# Patient Record
Sex: Female | Born: 1985 | Race: White | Hispanic: No
Health system: Midwestern US, Community
[De-identification: ages and names within clinical notes are randomized; demographics above are authoritative.]

## PROBLEM LIST (undated history)

## (undated) DIAGNOSIS — F1910 Other psychoactive substance abuse, uncomplicated: Secondary | ICD-10-CM

## (undated) DIAGNOSIS — J45909 Unspecified asthma, uncomplicated: Secondary | ICD-10-CM

## (undated) DIAGNOSIS — R569 Unspecified convulsions: Secondary | ICD-10-CM

## (undated) DIAGNOSIS — M5430 Sciatica, unspecified side: Secondary | ICD-10-CM

## (undated) DIAGNOSIS — F32A Depression, unspecified: Secondary | ICD-10-CM

## (undated) DIAGNOSIS — D649 Anemia, unspecified: Secondary | ICD-10-CM

## (undated) DIAGNOSIS — I639 Cerebral infarction, unspecified: Secondary | ICD-10-CM

## (undated) DIAGNOSIS — K5900 Constipation, unspecified: Secondary | ICD-10-CM

## (undated) DIAGNOSIS — J449 Chronic obstructive pulmonary disease, unspecified: Secondary | ICD-10-CM

## (undated) DIAGNOSIS — I10 Essential (primary) hypertension: Secondary | ICD-10-CM

## (undated) DIAGNOSIS — F419 Anxiety disorder, unspecified: Secondary | ICD-10-CM

## (undated) HISTORY — DX: Cerebral infarction, unspecified: I63.9

## (undated) HISTORY — DX: Chronic obstructive pulmonary disease, unspecified: J44.9

## (undated) HISTORY — DX: Anxiety disorder, unspecified: F41.9

## (undated) HISTORY — DX: Unspecified convulsions: R56.9

## (undated) HISTORY — DX: Other psychoactive substance abuse, uncomplicated: F19.10

## (undated) HISTORY — PX: TONSILLECTOMY: SUR1361

---
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
## 2006-02-13 ENCOUNTER — Ambulatory Visit (HOSPITAL_COMMUNITY): Admission: RE | Admit: 2006-02-13 | Discharge: 2006-02-13 | Payer: Self-pay | Admitting: General Surgery

## 2006-04-13 ENCOUNTER — Emergency Department (HOSPITAL_COMMUNITY): Admission: EM | Admit: 2006-04-13 | Discharge: 2006-04-14 | Payer: Self-pay | Admitting: *Deleted

## 2006-08-04 ENCOUNTER — Emergency Department (HOSPITAL_COMMUNITY): Admission: EM | Admit: 2006-08-04 | Discharge: 2006-08-04 | Payer: Self-pay | Admitting: Emergency Medicine

## 2008-10-04 DIAGNOSIS — G43909 Migraine, unspecified, not intractable, without status migrainosus: Secondary | ICD-10-CM | POA: Insufficient documentation

## 2009-03-03 DIAGNOSIS — M545 Low back pain, unspecified: Secondary | ICD-10-CM | POA: Insufficient documentation

## 2009-06-12 DIAGNOSIS — J45909 Unspecified asthma, uncomplicated: Secondary | ICD-10-CM | POA: Diagnosis present

## 2010-02-16 DIAGNOSIS — F411 Generalized anxiety disorder: Secondary | ICD-10-CM | POA: Insufficient documentation

## 2010-02-16 DIAGNOSIS — F309 Manic episode, unspecified: Secondary | ICD-10-CM | POA: Insufficient documentation

## 2010-05-25 NOTE — Op Note (Signed)
NAMEBUSHRA, Vicki Whitehead               ACCOUNT NO.:  [id number]   MEDICAL RECORD NO.:  [id number]          PATIENT TYPE:  AMB   LOCATION:  DAY                          FACILITY:  [facility]   PHYSICIAN:  Ollen Gross. Vernell Morgans, M.D. DATE OF BIRTH:  1985-06-24   DATE OF PROCEDURE:  02/13/2006  DATE OF DISCHARGE:                               OPERATIVE REPORT   PREOPERATIVE DIAGNOSIS:  Pilonidal cyst.   POSTOPERATIVE DIAGNOSIS:  Pilonidal cyst.   PROCEDURE:  I&D of pilonidal cyst.   SURGEON:  Dr. Carolynne Edouard   ANESTHESIA:  General endotracheal.   PROCEDURE:  After informed consent was obtained, the patient was brought  to the operating room, left in the supine position on the stretcher.  After adequate induction of general anesthesia, the patient was moved  into a prone position on the operating room table and all pressure  points padded.  The patient's buttocks were retracted laterally with  tape.  The gluteal cleft area was prepped with Betadine and draped in  usual sterile manner.  The patient had several pits in the skin of the  gluteal cleft.  These were probed with a small silver probe, and a small  little cavity was able to be appreciated.  The cavity was opened with a  15 blade knife cutting down on the silver probe.  The cavity was filled  with some hair and granulation tissue which was removed.  The wound was  debrided sharply with the electrocautery until the area was completely  clean and hemostatic.  The wound was infiltrated with 0.25% Marcaine.  Once the wound was cleaned, it was then packed with gauze, and sterile  dressings were applied.  The patient tolerated the procedure well.  At  the end of the case, all needle, sponge, and instrument counts were  correct.  The patient was then awakened and taken to recovery in stable  condition.      Ollen Gross. Vernell Morgans, M.D.  Electronically Signed     PST/MEDQ  D:  02/13/2006  T:  02/13/2006  Job:  [id number]

## 2010-10-22 LAB — URINE MICROSCOPIC-ADD ON

## 2010-10-22 LAB — CBC
HCT: 31.3 — ABNORMAL LOW
Hemoglobin: 11 — ABNORMAL LOW
RBC: 3.71 — ABNORMAL LOW
WBC: 13 — ABNORMAL HIGH

## 2010-10-22 LAB — URINE CULTURE: Culture: NO GROWTH

## 2010-10-22 LAB — URINALYSIS, ROUTINE W REFLEX MICROSCOPIC
Bilirubin Urine: NEGATIVE
Glucose, UA: NEGATIVE
Hgb urine dipstick: NEGATIVE
Nitrite: NEGATIVE
Protein, ur: NEGATIVE
Specific Gravity, Urine: 1.025
Urobilinogen, UA: 0.2
pH: 6.5

## 2010-10-22 LAB — DIFFERENTIAL
Eosinophils Relative: 1
Lymphocytes Relative: 17
Lymphs Abs: 2.2
Monocytes Absolute: 1.5 — ABNORMAL HIGH
Monocytes Relative: 12 — ABNORMAL HIGH

## 2010-10-22 LAB — BASIC METABOLIC PANEL
BUN: 6
CO2: 26
Calcium: 8.7
Chloride: 100
Creatinine, Ser: 0.6
GFR calc Af Amer: >60
GFR calc non Af Amer: >60
Glucose, Bld: 94
Potassium: 3.7
Sodium: 134 — ABNORMAL LOW

## 2010-10-22 LAB — PREGNANCY, URINE: Preg Test, Ur: POSITIVE

## 2011-06-12 DIAGNOSIS — R200 Anesthesia of skin: Secondary | ICD-10-CM | POA: Insufficient documentation

## 2012-04-13 DIAGNOSIS — Z5181 Encounter for therapeutic drug level monitoring: Secondary | ICD-10-CM | POA: Insufficient documentation

## 2012-04-13 DIAGNOSIS — M533 Sacrococcygeal disorders, not elsewhere classified: Secondary | ICD-10-CM | POA: Insufficient documentation

## 2012-04-13 DIAGNOSIS — M5430 Sciatica, unspecified side: Secondary | ICD-10-CM | POA: Insufficient documentation

## 2012-04-13 DIAGNOSIS — G894 Chronic pain syndrome: Secondary | ICD-10-CM | POA: Insufficient documentation

## 2012-04-13 DIAGNOSIS — M25552 Pain in left hip: Secondary | ICD-10-CM | POA: Insufficient documentation

## 2013-10-09 ENCOUNTER — Encounter (HOSPITAL_COMMUNITY): Payer: Self-pay | Admitting: Emergency Medicine

## 2013-10-09 ENCOUNTER — Emergency Department (HOSPITAL_COMMUNITY): Payer: Medicaid Other

## 2013-10-09 ENCOUNTER — Emergency Department (HOSPITAL_COMMUNITY)
Admission: EM | Admit: 2013-10-09 | Discharge: 2013-10-09 | Disposition: A | Discharge status: Home / Self Care | Payer: Medicaid Other | Attending: Emergency Medicine | Admitting: Emergency Medicine

## 2013-10-09 DIAGNOSIS — Z3202 Encounter for pregnancy test, result negative: Secondary | ICD-10-CM | POA: Insufficient documentation

## 2013-10-09 DIAGNOSIS — Z8719 Personal history of other diseases of the digestive system: Secondary | ICD-10-CM | POA: Insufficient documentation

## 2013-10-09 DIAGNOSIS — Z8739 Personal history of other diseases of the musculoskeletal system and connective tissue: Secondary | ICD-10-CM | POA: Diagnosis not present

## 2013-10-09 DIAGNOSIS — R109 Unspecified abdominal pain: Secondary | ICD-10-CM | POA: Diagnosis present

## 2013-10-09 DIAGNOSIS — Z720 Tobacco use: Secondary | ICD-10-CM | POA: Insufficient documentation

## 2013-10-09 DIAGNOSIS — Z862 Personal history of diseases of the blood and blood-forming organs and certain disorders involving the immune mechanism: Secondary | ICD-10-CM | POA: Insufficient documentation

## 2013-10-09 DIAGNOSIS — R1011 Right upper quadrant pain: Secondary | ICD-10-CM | POA: Insufficient documentation

## 2013-10-09 DIAGNOSIS — F1910 Other psychoactive substance abuse, uncomplicated: Secondary | ICD-10-CM | POA: Insufficient documentation

## 2013-10-09 DIAGNOSIS — Z79899 Other long term (current) drug therapy: Secondary | ICD-10-CM | POA: Insufficient documentation

## 2013-10-09 DIAGNOSIS — Z21 Asymptomatic human immunodeficiency virus [HIV] infection status: Secondary | ICD-10-CM | POA: Insufficient documentation

## 2013-10-09 DIAGNOSIS — I10 Essential (primary) hypertension: Secondary | ICD-10-CM | POA: Insufficient documentation

## 2013-10-09 DIAGNOSIS — B348 Other viral infections of unspecified site: Secondary | ICD-10-CM | POA: Diagnosis not present

## 2013-10-09 DIAGNOSIS — J45909 Unspecified asthma, uncomplicated: Secondary | ICD-10-CM | POA: Diagnosis not present

## 2013-10-09 DIAGNOSIS — N8320 Unspecified ovarian cysts: Secondary | ICD-10-CM

## 2013-10-09 DIAGNOSIS — B349 Viral infection, unspecified: Secondary | ICD-10-CM

## 2013-10-09 HISTORY — DX: Anemia, unspecified: D64.9

## 2013-10-09 HISTORY — DX: Unspecified asthma, uncomplicated: J45.909

## 2013-10-09 HISTORY — DX: Sciatica, unspecified side: M54.30

## 2013-10-09 HISTORY — DX: Essential (primary) hypertension: I10

## 2013-10-09 HISTORY — DX: Constipation, unspecified: K59.00

## 2013-10-09 LAB — CBC WITH DIFFERENTIAL/PLATELET
BASOS ABS: 0 10*3/uL (ref 0.0–0.1)
Basophils Relative: 0 % (ref 0–1)
EOS PCT: 2 % (ref 0–5)
Eosinophils Absolute: 0.2 10*3/uL (ref 0.0–0.7)
HEMATOCRIT: 32.1 % — AB (ref 36.0–46.0)
Hemoglobin: 11 g/dL — ABNORMAL LOW (ref 12.0–15.0)
LYMPHS ABS: 2 10*3/uL (ref 0.7–4.0)
Lymphocytes Relative: 23 % (ref 12–46)
MCH: 28 pg (ref 26.0–34.0)
MCHC: 34.3 g/dL (ref 30.0–36.0)
MCV: 81.7 fL (ref 78.0–100.0)
MONO ABS: 0.8 10*3/uL (ref 0.1–1.0)
MONOS PCT: 9 % (ref 3–12)
NEUTROS ABS: 5.9 10*3/uL (ref 1.7–7.7)
Neutrophils Relative %: 66 % (ref 43–77)
PLATELETS: 226 10*3/uL (ref 150–400)
RBC: 3.93 MIL/uL (ref 3.87–5.11)
RDW: 13.3 % (ref 11.5–15.5)
WBC: 8.9 10*3/uL (ref 4.0–10.5)

## 2013-10-09 LAB — URINALYSIS, ROUTINE W REFLEX MICROSCOPIC
BILIRUBIN URINE: NEGATIVE
Glucose, UA: NEGATIVE mg/dL
KETONES UR: NEGATIVE mg/dL
LEUKOCYTES UA: NEGATIVE
NITRITE: NEGATIVE
PROTEIN: NEGATIVE mg/dL
Specific Gravity, Urine: 1.014 (ref 1.005–1.030)
UROBILINOGEN UA: 0.2 mg/dL (ref 0.0–1.0)
pH: 6 (ref 5.0–8.0)

## 2013-10-09 LAB — COMPREHENSIVE METABOLIC PANEL
ALT: 36 U/L — ABNORMAL HIGH (ref 0–35)
ANION GAP: 14 (ref 5–15)
AST: 24 U/L (ref 0–37)
Albumin: 3.5 g/dL (ref 3.5–5.2)
Alkaline Phosphatase: 161 U/L — ABNORMAL HIGH (ref 39–117)
BILIRUBIN TOTAL: 0.5 mg/dL (ref 0.3–1.2)
BUN: 6 mg/dL (ref 6–23)
CALCIUM: 9.3 mg/dL (ref 8.4–10.5)
CHLORIDE: 99 meq/L (ref 96–112)
CO2: 24 meq/L (ref 19–32)
CREATININE: 0.69 mg/dL (ref 0.50–1.10)
Glucose, Bld: 145 mg/dL — ABNORMAL HIGH (ref 70–99)
Potassium: 3.5 mEq/L — ABNORMAL LOW (ref 3.7–5.3)
Sodium: 137 mEq/L (ref 137–147)
Total Protein: 8.1 g/dL (ref 6.0–8.3)

## 2013-10-09 LAB — RAPID HIV SCREEN (WH-MAU): SUDS RAPID HIV SCREEN: REACTIVE — AB

## 2013-10-09 LAB — HEPATITIS PANEL, ACUTE
HCV Ab: REACTIVE — AB
HEP A IGM: NONREACTIVE
HEP B C IGM: NONREACTIVE
Hepatitis B Surface Ag: NEGATIVE

## 2013-10-09 LAB — HEMOGLOBIN A1C
Hgb A1c MFr Bld: 5.2 % (ref <5.7)
Mean Plasma Glucose: 103 mg/dL (ref <117)

## 2013-10-09 LAB — INFLUENZA PANEL BY PCR (TYPE A & B)
H1N1FLUPCR: NOT DETECTED
Influenza A By PCR: NEGATIVE
Influenza B By PCR: NEGATIVE

## 2013-10-09 LAB — URINE MICROSCOPIC-ADD ON

## 2013-10-09 LAB — LIPASE, BLOOD: LIPASE: 23 U/L (ref 11–59)

## 2013-10-09 LAB — RPR

## 2013-10-09 LAB — POC URINE PREG, ED: Preg Test, Ur: NEGATIVE

## 2013-10-09 MED ORDER — ONDANSETRON 4 MG PO TBDP: ORAL_TABLET | ORAL | Status: DC

## 2013-10-09 MED ORDER — CLONIDINE HCL 0.1 MG PO TABS: ORAL_TABLET | ORAL | Status: DC

## 2013-10-09 MED ORDER — ONDANSETRON HCL 4 MG/2ML IJ SOLN
4.0000 mg | Freq: Once | INTRAMUSCULAR | Status: AC
  Administered 2013-10-09: 4 mg via INTRAVENOUS
  Filled 2013-10-09: qty 2

## 2013-10-09 MED ORDER — SODIUM CHLORIDE 0.9 % IV SOLN
1000.0000 mL | Freq: Once | INTRAVENOUS | Status: AC
  Administered 2013-10-09: 1000 mL via INTRAVENOUS

## 2013-10-09 MED ORDER — IBUPROFEN 600 MG PO TABS: 600.0000 mg | ORAL_TABLET | Freq: Four times a day (QID) | ORAL | Status: DC | PRN

## 2013-10-09 MED ORDER — SODIUM CHLORIDE 0.9 % IV SOLN: INTRAVENOUS | Status: DC

## 2013-10-09 MED ORDER — ACETAMINOPHEN 500 MG PO TABS
1000.0000 mg | ORAL_TABLET | Freq: Once | ORAL | Status: AC
  Administered 2013-10-09: 1000 mg via ORAL
  Filled 2013-10-09: qty 2

## 2013-10-09 MED ORDER — IOHEXOL 300 MG/ML  SOLN
100.0000 mL | Freq: Once | INTRAMUSCULAR | Status: AC | PRN
  Administered 2013-10-09: 100 mL via INTRAVENOUS

## 2013-10-09 NOTE — ED Notes (Signed)
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  Reported rehab x402months.  Last used this morning at 3a, states "small amount".

## 2013-10-09 NOTE — Consult Note (Signed)
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  I gave her my card and highlighted the number where to find [place].   Thank you so much for testing this patient and for allowing [place] to be involved in the continuity of her care,   Bobby Rumpf (pager) [phone] [email or web address]  [name] is an 28 y.o. female.  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 She aldo complains of neck pain and abd pain.  Her ED w/u has been unremarkable except for new HIV+ test.  Pt states she is with same sex partner for several years. She does use IV drugs.   Past Medical History  Diagnosis Date  . Anemia   . Asthma   . Sciatica   . Hypertension   . Constipation     Past Surgical History  Procedure Laterality Date  . Tonsillectomy    . Cesarean section       No Known Allergies  Medications: Scheduled:   Abtx:  Anti-infectives   None      Total days of antibiotics 0          Social History:  reports that she has been smoking.  She does not have any smokeless tobacco history on file. She reports that she uses illicit drugs (Methamphetamines). She reports that she does not drink alcohol.  History reviewed. No pertinent family history.  General ROS: no change in wt. no LAN, no oral ulcers. constipation+. normal urination. see HPI.   Blood pressure 132/78, pulse 95, temperature 98.3 F (36.8 C), temperature source Oral, resp. rate 22, weight 88.451 kg (195 lb), last menstrual period 08/09/2013, SpO2 100.00%. General appearance: alert, cooperative and no distress Eyes: negative  findings: conjunctivae and sclerae normal and pupils equal, round, reactive to light and accomodation Throat: normal findings: oropharynx pink & moist without lesions or evidence of thrush Neck: no adenopathy and supple, symmetrical, trachea midline Lungs: clear to auscultation bilaterally Heart: regular rate and rhythm Abdomen: normal findings: bowel sounds normal and soft, non-tender Extremities: no edema. UE non-tender, no cordis. multiple wounds on her wristss from injection sites.  No axillary LAN.  She moves her neck fully.    Results for orders placed during the hospital encounter of 10/09/13 (from the past 48 hour(s))  CBC WITH DIFFERENTIAL     Status: Abnormal   Collection Time    10/09/13 12:09 PM      Result Value Ref Range   WBC 8.9  4.0 - 10.5 K/uL   RBC 3.93  3.87 - 5.11 MIL/uL   Hemoglobin 11.0 (*) 12.0 - 15.0 g/dL   HCT 32.1 (*) 36.0 - 46.0 %   MCV 81.7  78.0 - 100.0 fL   MCH 28.0  26.0 - 34.0 pg   MCHC 34.3  30.0 - 36.0 g/dL   RDW 13.3  11.5 - 15.5 %   Platelets 226  150 - 400 K/uL   Neutrophils Relative % 66  43 - 77 %   Lymphocytes Relative 23  12 - 46 %   Monocytes Relative 9  3 - 12 %   Eosinophils Relative 2  0 - 5 %   Basophils Relative 0  0 - 1 %   Neutro Abs 5.9  1.7 - 7.7 K/uL   Lymphs Abs 2.0  0.7 - 4.0 K/uL   Monocytes Absolute 0.8  0.1 - 1.0 K/uL   Eosinophils Absolute 0.2  0.0 - 0.7 K/uL   Basophils Absolute 0.0  0.0 - 0.1 K/uL   WBC Morphology ATYPICAL LYMPHOCYTES    COMPREHENSIVE METABOLIC PANEL     Status: Abnormal   Collection Time    10/09/13 12:09 PM      Result Value Ref Range   Sodium 137  137 - 147 mEq/L   Potassium 3.5 (*) 3.7 - 5.3 mEq/L   Chloride 99  96 - 112 mEq/L   CO2 24  19 - 32 mEq/L   Glucose, Bld 145 (*) 70 - 99 mg/dL   BUN 6  6 - 23 mg/dL   Creatinine, Ser 0.69  0.50 - 1.10 mg/dL   Calcium 9.3  8.4 - 10.5 mg/dL   Total Protein 8.1  6.0 - 8.3 g/dL   Albumin 3.5  3.5 - 5.2 g/dL   AST 24  0 - 37 U/L   ALT 36 (*) 0 - 35  U/L   Alkaline Phosphatase 161 (*) 39 - 117 U/L   Total Bilirubin 0.5  0.3 - 1.2 mg/dL   GFR calc non Af Amer >90  >90 mL/min   GFR calc Af Amer >90  >90 mL/min   Comment: (NOTE)     The eGFR has been calculated using the CKD EPI equation.     This calculation has not been validated in all clinical situations.     eGFR's persistently <90 mL/min signify possible Chronic Kidney     Vicki Whitehead.   Anion gap 14  5 - 15  LIPASE, BLOOD     Status: None   Collection Time    10/09/13 12:09 PM      Result Value Ref Range   Lipase 23  11 - 59 U/L  RAPID HIV SCREEN [facility])     Status: Abnormal   Collection Time    10/09/13  1:13 PM      Result Value Ref Range   SUDS Rapid HIV Screen Reactive (*) NON REACTIVE   Comment: HAMILTON,J AT 1440 ON [id number] BY HOOKER,B                This test is not diagnostic     of HIV infection and must be     sent for confirmatory testing     by [facility]ot before rendering     a diagnosis of positivity for     HIV infection.     Sent for confirmatory testing.     CRITICAL RESULT CALLED TO, READ BACK BY AND VERIFIED WITH:     CORRECTED ON 10/03 AT 1445: PREVIOUSLY REPORTED AS RESISTANT HAMILTON,J AT 1440 ON [id number] BY HOOKER,B  URINALYSIS, ROUTINE W REFLEX MICROSCOPIC     Status: Abnormal   Collection Time    10/09/13  1:22 PM      Result Value Ref Range   Color, Urine YELLOW  YELLOW   APPearance CLOUDY (*) CLEAR   Specific Gravity, Urine 1.014  1.005 - 1.030   pH 6.0  5.0 - 8.0   Glucose, UA NEGATIVE  NEGATIVE mg/dL   Hgb urine dipstick MODERATE (*) NEGATIVE   Bilirubin Urine NEGATIVE  NEGATIVE  Ketones, ur NEGATIVE  NEGATIVE mg/dL   Protein, ur NEGATIVE  NEGATIVE mg/dL   Urobilinogen, UA 0.2  0.0 - 1.0 mg/dL   Nitrite NEGATIVE  NEGATIVE   Leukocytes, UA NEGATIVE  NEGATIVE  URINE MICROSCOPIC-ADD ON     Status: Abnormal   Collection Time    10/09/13  1:22 PM      Result Value Ref Range   Squamous Epithelial / LPF FEW (*) RARE   WBC, UA 0-2  <3  WBC/hpf   RBC / HPF 3-6  <3 RBC/hpf   Bacteria, UA RARE  RARE   Urine-Other MUCOUS PRESENT    POC URINE PREG, ED     Status: None   Collection Time    10/09/13  1:28 PM      Result Value Ref Range   Preg Test, Ur NEGATIVE  NEGATIVE   Comment:            THE SENSITIVITY OF THIS     METHODOLOGY IS >24 mIU/mL      Component Value Date/Time   SDES URINE, CLEAN CATCH 08/04/2006 1333   SPECREQUEST NONE 08/04/2006 1333   CULT NO GROWTH 08/04/2006 1333   REPTSTATUS 08/05/2006 FINAL 08/04/2006 1333   Dg Chest 2 View  10/09/2013   CLINICAL DATA:  Shortness of breath, cough, and fever with right-sided chest pain; history of asthma and tobacco use  EXAM: CHEST  2 VIEW  COMPARISON:  None.  FINDINGS: The lungs are adequately inflated and clear. The heart and pulmonary vascularity are normal. The mediastinum is normal in width. There is no pleural effusion or pneumothorax. The bony thorax is unremarkable.  IMPRESSION: There is no evidence of pneumonia nor other acute cardiopulmonary abnormality.   Electronically Signed   By: David  Martinique   On: 10/09/2013 13:39   Ct Abdomen Pelvis W Contrast  10/09/2013   CLINICAL DATA:  Sick for 5 days, with elevated liver enzymes, nausea, and diarrhea, history of C-section  EXAM: CT ABDOMEN AND PELVIS WITH CONTRAST  TECHNIQUE: Multidetector CT imaging of the abdomen and pelvis was performed using the standard protocol following bolus administration of intravenous contrast.  CONTRAST:  18m OMNIPAQUE IOHEXOL 300 MG/ML  SOLN  COMPARISON:  None.  FINDINGS: Liver is enlarged at 21.4 cm. Spleen is enlarged at 16.7 cm. No focal hepatic or splenic lesions identified. There is fluid in the gallbladder fossa. There may be sludge within the gallbladder lumen.  The pancreas is normal. Adrenal glands are normal. Kidneys are normal.  There are numerous small retroperitoneal periaortic and aortocaval lymph nodes, with the largest measuring 9 mm in short axis, below the level of the left  kidney.  Stomach is normal. Small bowel is normal. Appendix is normal. Large bowel is normal.  Bladder is normal. Uterus is normal. 27 mm cystic lesion left ovary noted. Tiny nodule within the anterior aspect of the cyst may represent volume averaging, appearing as a 3 mm nodule. Right adnexa normal.  No free fluid in the abdomen or pelvis. No acute musculoskeletal findings. Visualized portions of the lung bases clear.  IMPRESSION: Hepatosplenomegaly with no focal hepatic or splenic abnormalities.  Fluid in the gallbladder fossa which is a nonspecific finding given report of and findings to suggest hepatic parenchymal Vicki Whitehead. However cholecystitis not excluded. There may be sludge in the gallbladder. If indicated consider right upper quadrant abdomen ultrasound.  2.7 mm probably benign left ovarian cystic lesion. Given the appearance of a tiny nodule however, would recommend further characterization  with pelvic ultrasound. This recommendation follows ACR consensus guidelines: White Paper of the ACR Incidental Findings Committee II on Adnexal Findings. J Am Coll Radiol [phone].   Electronically Signed   By: Skipper Cliche M.D.   On: 10/09/2013 15:57   No results found for this or any previous visit (from the past 240 hour(s)).    10/09/2013, 4:11 PM     LOS: 0 days

## 2013-10-09 NOTE — Discharge Instructions (Signed)
Abdominal Pain Many things can cause abdominal pain. Usually, abdominal pain is not caused by a disease and will improve without treatment. It can often be observed and treated at home. Your health care provider will do a physical exam and possibly order blood tests and X-rays to help determine the seriousness of your pain. However, in many cases, more time must pass before a clear cause of the pain can be found. Before that point, your health care provider may not know if you need more testing or further treatment. HOME CARE INSTRUCTIONS  Monitor your abdominal pain for any changes. The following actions may help to alleviate any discomfort you are experiencing:  Only take over-the-counter or prescription medicines as directed by your health care provider.  Do not take laxatives unless directed to do so by your health care provider.  Try a clear liquid diet (broth, tea, or water) as directed by your health care provider. Slowly move to a bland diet as tolerated. SEEK MEDICAL CARE IF:  You have unexplained abdominal pain.  You have abdominal pain associated with nausea or diarrhea.  You have pain when you urinate or have a bowel movement.  You experience abdominal pain that wakes you in the night.  You have abdominal pain that is worsened or improved by eating food.  You have abdominal pain that is worsened with eating fatty foods.  You have a fever. SEEK IMMEDIATE MEDICAL CARE IF:   Your pain does not go away within 2 hours.  You keep throwing up (vomiting).  Your pain is felt only in portions of the abdomen, such as the right side or the left lower portion of the abdomen.  You pass bloody or black tarry stools. MAKE SURE YOU:  Understand these instructions.   Will watch your condition.   Will get help right away if you are not doing well or get worse.  Document Released: 10/03/2004 Document Revised: 12/29/2012 Document Reviewed: 09/02/2012 [facility] Patient Information  2015 [place]. This information is not intended to replace advice given to you by your health care provider. Make sure you discuss any questions you have with your health care provider. HIV Antibody Test HIV is a virus which destroys our body's ability to fight illness. It does this by causing defects in our immune system. This is the system that protects our body against infections. This virus is the cause of an illness called AIDS. HIV antibodies are made by the infected person's body when a person becomes infected with HIV. WHAT IS THE HIV ANTIBODY TEST? This is a test for HIV antibodies that are found in the blood of an infected person. This test is not a test for AIDS. It only means that you have been infected with HIV and may eventually develop AIDS. WHO IS AT RISK OF BEING INFECTED WITH HIV?  People who have unsafe sex (unsafe sex means having sex without a condom (or other protective barrier) with a person who has the virus.  People who share IV needles or syringes with a person who has the virus.  Anyone who got blood, blood products, or organ transplants before 1985.  Babies born to mothers who have HIV.  Coming in contact with blood or other body fluids of someone infected with HIV. WHAT DOES A NEGATIVE HIV TEST RESULT MEAN? HIV antibodies were not found in your blood. Most of the time it takes our bodies between 6 weeks and 6 months to develop antibodies to HIV. It may take up  to one year to develop. During this time, infected people can have a negative result even if they have the virus and will therefore not know if they are putting other people at risk. They should take all necessary precautions to protect others from becoming infected. WHAT DOES A POSITIVE HIV TEST RESULT MEAN?  A positive HIV test means a person has been infected with the HIV virus. This does NOT mean that a person has AIDS, but they may eventually develop it.  A person can give HIV infection to other  people through unsafe sex. Sharing IV needles or syringes can also spread HIV.  A woman who has HIV can give the virus to her baby during pregnancy or at birth or possibly from breastfeeding. Get counseling prior to considering pregnancy. One third to one half of women with HIV infection will pass this infection on to their baby.  Anyone with a positive test for HIV should not donate blood, plasma, blood products, organs or tissues. WHERE CAN I GO TO BE TESTED?  Most county health departments offer HIV Antibody counseling and testing.  Many doctors and other caregivers offer HIV Antibody counseling and testing. If you received a blood test from your caregiver, call for your results as instructed. Remember it is your responsibility to get the results of your test. Do not assume everything is fine if you do not hear from your caregiver. If you get a positive test result, talk to your caregiver to find out what steps to take to assure you receive the best of care. Numerous medications are now available which improve the course of this infection. Document Released: 12/22/1999 Document Revised: 03/18/2011 Document Reviewed: 03/26/2013 [facility] Patient Information 2015 [place], [place]. This information is not intended to replace advice given to you by your health care provider. Make sure you discuss any questions you have with your health care provider.

## 2013-10-09 NOTE — ED Provider Notes (Signed)
CSN: [id number]     Arrival date & time 10/09/13  1135 History   First MD Initiated Contact with Patient 10/09/13 1152     Chief Complaint  Patient presents with  . Fever  . Headache  . Abdominal Pain     (Consider location/radiation/quality/duration/timing/severity/associated sxs/prior Treatment) HPI Onset 5 days ago. Right flank/low back pain.Worse with cough or deep breath. Also headache and neck stiffness. H/A throbbing, generalized. Worse with position change. Waxes and wanes in severity. Patient identifies rt. flank pain as worst symptom. Patient reports fever at home from 101-103.7. Nausea, no vomiting, some diarrhea. Sexually active with fiancee 8 years, doubts risk of STD. Patient has a long history of heroin use, she had gone through rehabilitation but then resumed using approximately 2 months ago.  Past Medical History  Diagnosis Date  . Anemia   . Asthma   . Sciatica   . Hypertension   . Constipation    Past Surgical History  Procedure Laterality Date  . Tonsillectomy    . Cesarean section     History reviewed. No pertinent family history. History  Substance Use Topics  . Smoking status: Current Every Day Smoker  . Smokeless tobacco: Not on file  . Alcohol Use: No   OB History   Grav Para Term Preterm Abortions TAB SAB Ect Mult Living                 Review of Systems  10 Systems reviewed and are negative for acute change except as noted in the HPI.  Allergies  Review of patient's allergies indicates no known allergies.  Home Medications   Prior to Admission medications   Medication Sig Start Date End Date Taking? Authorizing Provider  acetaminophen (TYLENOL) 500 MG tablet Take 500 mg by mouth every 6 (six) hours as needed.   Yes Historical Provider, MD  albuterol (PROVENTIL HFA;VENTOLIN HFA) 108 (90 BASE) MCG/ACT inhaler Inhale 2 puffs into the lungs every 6 (six) hours as needed for wheezing or shortness of breath.   Yes Historical Provider, MD   medroxyPROGESTERone (DEPO-PROVERA) 150 MG/ML injection Inject 150 mg into the muscle every 3 (three) months.   Yes Historical Provider, MD  QUEtiapine (SEROQUEL XR) 300 MG 24 hr tablet Take 300 mg by mouth at bedtime.   Yes Historical Provider, MD  cloNIDine (CATAPRES) 0.1 MG tablet 1 tab po tid x 2 days, then bid x 2 days, then once daily x 2 days 10/09/13   Charlesetta Shanks, MD  ibuprofen (ADVIL,MOTRIN) 600 MG tablet Take 1 tablet (600 mg total) by mouth every 6 (six) hours as needed. 10/09/13   Charlesetta Shanks, MD  ondansetron (ZOFRAN ODT) 4 MG disintegrating tablet 7m ODT q4 hours prn nausea/vomit 10/09/13   MCharlesetta Shanks MD   BP 132/78  Pulse 95  Temp(Src) 98.3 F (36.8 C) (Oral)  Resp 22  Wt 195 lb (88.451 kg)  SpO2 100%  LMP 08/09/2013 Physical Exam Constitutional: The patient is nontoxic, her mental status is clear, she is well-nourished well-developed. Eyes: Pupils are equally round and reactive to light extraocular motions are intact, no injection Oral cavity: Makes memories pink moist posterior oropharynx widely patent, no exudate or erythema. Neck: Supple without meningismus no significant lymphadenopathy Lungs: Clear to auscultation without wheeze rhonchi rail Heart: Tachycardic no rub murmur gallop Abdomen: Soft, nondistended, no palpable mass, patient has mild to moderate right lateral and upper quadrant pain. She does not guard. Back: Normal visual inspection mild right CVA tenderness. Extremities: Lower  joints are normal without calf tenderness or peripheral edema. The skin of the lower extremities is in excellent condition. A right upper extremity has multiple track marks on the volar surface of the lower arm, there however does not appear to be any secondary infection or abscess at this point time. There is also an injection site on the dorsum of her hand which again does not appear to have any acute cellulitis or abscess associated. There are several areas of tract marks on  the left upper extremity as well again none of these have apparent cellulitis or abscess. Neurologic: Patient is alert oriented x3, cognitive function is normal and intact. Speech is clear, movements are coordinated purposeful and symmetric without difficulty. Skin: Warm and dry, normal except as outlined with injection sites. ED Course  Procedures (including critical care time) Labs Review Labs Reviewed  CBC WITH DIFFERENTIAL - Abnormal; Notable for the following:    Hemoglobin 11.0 (*)    HCT 32.1 (*)    All other components within normal limits  COMPREHENSIVE METABOLIC PANEL - Abnormal; Notable for the following:    Potassium 3.5 (*)    Glucose, Bld 145 (*)    ALT 36 (*)    Alkaline Phosphatase 161 (*)    All other components within normal limits  URINALYSIS, ROUTINE W REFLEX MICROSCOPIC - Abnormal; Notable for the following:    APPearance CLOUDY (*)    Hgb urine dipstick MODERATE (*)    All other components within normal limits  RAPID HIV SCREEN ([facility]) - Abnormal; Notable for the following:    SUDS Rapid HIV Screen Reactive (*)    All other components within normal limits  URINE MICROSCOPIC-ADD ON - Abnormal; Notable for the following:    Squamous Epithelial / LPF FEW (*)    All other components within normal limits  CULTURE, BLOOD (ROUTINE X 2)  CULTURE, BLOOD (ROUTINE X 2)  GC/CHLAMYDIA PROBE AMP  LIPASE, BLOOD  INFLUENZA PANEL BY PCR (TYPE A & B, H1N1)  HEPATITIS PANEL, ACUTE  HIV 1/2 CONFIRMATION  HEMOGLOBIN A1C  HIV-1 RNA ULTRAQUANT REFLEX TO GENTYP+  T-HELPER CELLS (CD4) COUNT  RPR  HLA B*5701  HIV-1 INTEGRASE GENOTYPE  POC URINE PREG, ED    Imaging Review Dg Chest 2 View  10/09/2013   CLINICAL DATA:  Shortness of breath, cough, and fever with right-sided chest pain; history of asthma and tobacco use  EXAM: CHEST  2 VIEW  COMPARISON:  None.  FINDINGS: The lungs are adequately inflated and clear. The heart and pulmonary vascularity are normal. The mediastinum  is normal in width. There is no pleural effusion or pneumothorax. The bony thorax is unremarkable.  IMPRESSION: There is no evidence of pneumonia nor other acute cardiopulmonary abnormality.   Electronically Signed   By: David  Martinique   On: 10/09/2013 13:39   Ct Abdomen Pelvis W Contrast  10/09/2013   CLINICAL DATA:  Sick for 5 days, with elevated liver enzymes, nausea, and diarrhea, history of C-section  EXAM: CT ABDOMEN AND PELVIS WITH CONTRAST  TECHNIQUE: Multidetector CT imaging of the abdomen and pelvis was performed using the standard protocol following bolus administration of intravenous contrast.  CONTRAST:  147m OMNIPAQUE IOHEXOL 300 MG/ML  SOLN  COMPARISON:  None.  FINDINGS: Liver is enlarged at 21.4 cm. Spleen is enlarged at 16.7 cm. No focal hepatic or splenic lesions identified. There is fluid in the gallbladder fossa. There may be sludge within the gallbladder lumen.  The pancreas is normal. Adrenal glands  are normal. Kidneys are normal.  There are numerous small retroperitoneal periaortic and aortocaval lymph nodes, with the largest measuring 9 mm in short axis, below the level of the left kidney.  Stomach is normal. Small bowel is normal. Appendix is normal. Large bowel is normal.  Bladder is normal. Uterus is normal. 27 mm cystic lesion left ovary noted. Tiny nodule within the anterior aspect of the cyst may represent volume averaging, appearing as a 3 mm nodule. Right adnexa normal.  No free fluid in the abdomen or pelvis. No acute musculoskeletal findings. Visualized portions of the lung bases clear.  IMPRESSION: Hepatosplenomegaly with no focal hepatic or splenic abnormalities.  Fluid in the gallbladder fossa which is a nonspecific finding given report of and findings to suggest hepatic parenchymal disease. However cholecystitis not excluded. There may be sludge in the gallbladder. If indicated consider right upper quadrant abdomen ultrasound.  2.7 mm probably benign left ovarian cystic  lesion. Given the appearance of a tiny nodule however, would recommend further characterization with pelvic ultrasound. This recommendation follows ACR consensus guidelines: White Paper of the ACR Incidental Findings Committee II on Adnexal Findings. J Am Coll Radiol [phone].   Electronically Signed   By: Skipper Cliche M.D.   On: 10/09/2013 15:57     EKG Interpretation None     Dr. Johnnye Sima of infectious disease was consults it. He evaluated the patient in the emergency department. There is a plan in place for her to have followup with him on Monday or Tuesday to continue her evaluation for HIV and treatment. At this point time hepatitis panel is still pending as well. MDM   Final diagnoses:  Systemic viral illness  HIV test positive  Right upper quadrant pain  IV drug abuse   At this point time patient has a complex problem set. She presents with reported febrile illness at home and history of active IV drug abuse. Today she tells positive for HIV and CT does reveal some diffuse hepatosplenomegaly. Currently we are still awaiting hepatitis panel. The patient however is nontoxic alert and does not appear to have focal bacterial etiology of her symptoms. Although she does have track marks none appear to be acutely infected. At this point time a plan in place for her to have followup with infectious disease within 2-3 days. She has requested clonidine to help with heroin withdrawal symptoms which I will provide. As well as Zofran for nausea. The patient is counseled to return or she is worsening or changing symptoms otherwise followup as established.    Charlesetta Shanks, MD 10/09/13 [phone]

## 2013-10-09 NOTE — ED Notes (Signed)
Pt states that she has been having fevers of 101-103.7 at home.  States that she has headache, neck pain (pt is able to flex neck to chin), RUQ abd pain, low back pain , and joint pain.  Mom states "her liver is swollen".  Pt states that she was told previously that her "liver was inflamed".  Has had diarrhea and nausea but no vomiting.

## 2013-10-09 NOTE — ED Notes (Signed)
Patient is being transported to xray 

## 2013-10-10 LAB — HIV-1 RNA ULTRAQUANT REFLEX TO GENTYP+: HIV-1 RNA Quant, Log: <1.3 {Log} (ref <1.30)

## 2013-10-11 ENCOUNTER — Telehealth: Payer: Self-pay | Admitting: Infectious Disease

## 2013-10-11 ENCOUNTER — Ambulatory Visit: Payer: Self-pay

## 2013-10-11 ENCOUNTER — Emergency Department (HOSPITAL_COMMUNITY): Payer: Medicaid Other

## 2013-10-11 ENCOUNTER — Telehealth: Payer: Self-pay | Admitting: *Deleted

## 2013-10-11 ENCOUNTER — Inpatient Hospital Stay (HOSPITAL_COMMUNITY)
Admission: EM | Admit: 2013-10-11 | Discharge: 2013-10-13 | DRG: 868 | Disposition: A | Discharge status: Home / Self Care | Payer: Medicaid Other | Attending: Internal Medicine | Admitting: Internal Medicine

## 2013-10-11 ENCOUNTER — Encounter (HOSPITAL_COMMUNITY): Payer: Self-pay | Admitting: Emergency Medicine

## 2013-10-11 ENCOUNTER — Inpatient Hospital Stay (HOSPITAL_COMMUNITY): Payer: Medicaid Other

## 2013-10-11 DIAGNOSIS — Z2252 Carrier of viral hepatitis C: Secondary | ICD-10-CM

## 2013-10-11 DIAGNOSIS — F329 Major depressive disorder, single episode, unspecified: Secondary | ICD-10-CM | POA: Diagnosis present

## 2013-10-11 DIAGNOSIS — D649 Anemia, unspecified: Secondary | ICD-10-CM | POA: Diagnosis present

## 2013-10-11 DIAGNOSIS — Z79899 Other long term (current) drug therapy: Secondary | ICD-10-CM

## 2013-10-11 DIAGNOSIS — B49 Unspecified mycosis: Principal | ICD-10-CM

## 2013-10-11 DIAGNOSIS — B9735 Human immunodeficiency virus, type 2 [HIV 2] as the cause of diseases classified elsewhere: Secondary | ICD-10-CM

## 2013-10-11 DIAGNOSIS — B379 Candidiasis, unspecified: Secondary | ICD-10-CM | POA: Diagnosis present

## 2013-10-11 DIAGNOSIS — R7689 Other specified abnormal immunological findings in serum: Secondary | ICD-10-CM

## 2013-10-11 DIAGNOSIS — J45909 Unspecified asthma, uncomplicated: Secondary | ICD-10-CM | POA: Diagnosis present

## 2013-10-11 DIAGNOSIS — F1990 Other psychoactive substance use, unspecified, uncomplicated: Secondary | ICD-10-CM

## 2013-10-11 DIAGNOSIS — F1510 Other stimulant abuse, uncomplicated: Secondary | ICD-10-CM | POA: Diagnosis present

## 2013-10-11 DIAGNOSIS — F319 Bipolar disorder, unspecified: Secondary | ICD-10-CM | POA: Diagnosis present

## 2013-10-11 DIAGNOSIS — M5430 Sciatica, unspecified side: Secondary | ICD-10-CM | POA: Diagnosis present

## 2013-10-11 DIAGNOSIS — F419 Anxiety disorder, unspecified: Secondary | ICD-10-CM | POA: Diagnosis present

## 2013-10-11 DIAGNOSIS — R75 Inconclusive laboratory evidence of human immunodeficiency virus [HIV]: Secondary | ICD-10-CM | POA: Diagnosis present

## 2013-10-11 DIAGNOSIS — F1910 Other psychoactive substance abuse, uncomplicated: Secondary | ICD-10-CM

## 2013-10-11 DIAGNOSIS — F17210 Nicotine dependence, cigarettes, uncomplicated: Secondary | ICD-10-CM | POA: Diagnosis present

## 2013-10-11 DIAGNOSIS — F1099 Alcohol use, unspecified with unspecified alcohol-induced disorder: Secondary | ICD-10-CM | POA: Diagnosis present

## 2013-10-11 DIAGNOSIS — E876 Hypokalemia: Secondary | ICD-10-CM

## 2013-10-11 DIAGNOSIS — I10 Essential (primary) hypertension: Secondary | ICD-10-CM | POA: Diagnosis present

## 2013-10-11 DIAGNOSIS — Z789 Other specified health status: Secondary | ICD-10-CM

## 2013-10-11 DIAGNOSIS — R768 Other specified abnormal immunological findings in serum: Secondary | ICD-10-CM | POA: Diagnosis present

## 2013-10-11 DIAGNOSIS — F3130 Bipolar disorder, current episode depressed, mild or moderate severity, unspecified: Secondary | ICD-10-CM

## 2013-10-11 DIAGNOSIS — F1110 Opioid abuse, uncomplicated: Secondary | ICD-10-CM | POA: Diagnosis present

## 2013-10-11 DIAGNOSIS — F32A Depression, unspecified: Secondary | ICD-10-CM | POA: Diagnosis present

## 2013-10-11 DIAGNOSIS — O99320 Drug use complicating pregnancy, unspecified trimester: Secondary | ICD-10-CM | POA: Diagnosis present

## 2013-10-11 DIAGNOSIS — J4520 Mild intermittent asthma, uncomplicated: Secondary | ICD-10-CM

## 2013-10-11 LAB — COMPREHENSIVE METABOLIC PANEL
ALBUMIN: 3.5 g/dL (ref 3.5–5.2)
ALT: 24 U/L (ref 0–35)
ANION GAP: 14 (ref 5–15)
AST: 20 U/L (ref 0–37)
Alkaline Phosphatase: 121 U/L — ABNORMAL HIGH (ref 39–117)
BUN: 13 mg/dL (ref 6–23)
CALCIUM: 8.8 mg/dL (ref 8.4–10.5)
CHLORIDE: 104 meq/L (ref 96–112)
CO2: 19 mEq/L (ref 19–32)
CREATININE: 0.69 mg/dL (ref 0.50–1.10)
GFR calc Af Amer: >90 mL/min (ref >90)
GFR calc non Af Amer: >90 mL/min (ref >90)
Glucose, Bld: 113 mg/dL — ABNORMAL HIGH (ref 70–99)
Potassium: 3.7 mEq/L (ref 3.7–5.3)
Sodium: 137 mEq/L (ref 137–147)
TOTAL PROTEIN: 8.3 g/dL (ref 6.0–8.3)
Total Bilirubin: 0.4 mg/dL (ref 0.3–1.2)

## 2013-10-11 LAB — CBC WITH DIFFERENTIAL/PLATELET
BASOS ABS: 0 10*3/uL (ref 0.0–0.1)
BASOS PCT: 0 % (ref 0–1)
EOS ABS: 0.1 10*3/uL (ref 0.0–0.7)
Eosinophils Relative: 1 % (ref 0–5)
HEMATOCRIT: 33.2 % — AB (ref 36.0–46.0)
Hemoglobin: 11.3 g/dL — ABNORMAL LOW (ref 12.0–15.0)
Lymphocytes Relative: 15 % (ref 12–46)
Lymphs Abs: 1.7 10*3/uL (ref 0.7–4.0)
MCH: 28 pg (ref 26.0–34.0)
MCHC: 34 g/dL (ref 30.0–36.0)
MCV: 82.2 fL (ref 78.0–100.0)
MONO ABS: 0.3 10*3/uL (ref 0.1–1.0)
MONOS PCT: 3 % (ref 3–12)
Neutro Abs: 9.5 10*3/uL — ABNORMAL HIGH (ref 1.7–7.7)
Neutrophils Relative %: 81 % — ABNORMAL HIGH (ref 43–77)
Platelets: 349 10*3/uL (ref 150–400)
RBC: 4.04 MIL/uL (ref 3.87–5.11)
RDW: 13.2 % (ref 11.5–15.5)
WBC: 11.7 10*3/uL — ABNORMAL HIGH (ref 4.0–10.5)

## 2013-10-11 LAB — URINE MICROSCOPIC-ADD ON

## 2013-10-11 LAB — URINALYSIS, ROUTINE W REFLEX MICROSCOPIC
Bilirubin Urine: NEGATIVE
GLUCOSE, UA: NEGATIVE mg/dL
Hgb urine dipstick: NEGATIVE
Ketones, ur: NEGATIVE mg/dL
Nitrite: NEGATIVE
PH: 5.5 (ref 5.0–8.0)
Protein, ur: NEGATIVE mg/dL
Specific Gravity, Urine: 1.02 (ref 1.005–1.030)
Urobilinogen, UA: 0.2 mg/dL (ref 0.0–1.0)

## 2013-10-11 LAB — T-HELPER CELLS (CD4) COUNT (NOT AT ARMC)
CD4 T CELL ABS: 810 /uL (ref 400–2700)
CD4 T CELL HELPER: 39 % (ref 33–55)

## 2013-10-11 MED ORDER — DEXTROSE 5 % IV SOLN
1.0000 g | Freq: Once | INTRAVENOUS | Status: AC
  Administered 2013-10-11: 1 g via INTRAVENOUS
  Filled 2013-10-11: qty 10

## 2013-10-11 MED ORDER — ALBUTEROL SULFATE (2.5 MG/3ML) 0.083% IN NEBU: 3.0000 mL | INHALATION_SOLUTION | Freq: Four times a day (QID) | RESPIRATORY_TRACT | Status: DC | PRN

## 2013-10-11 MED ORDER — FLUCONAZOLE IN SODIUM CHLORIDE 400-0.9 MG/200ML-% IV SOLN
800.0000 mg | Freq: Once | INTRAVENOUS | Status: AC
  Administered 2013-10-11: 800 mg via INTRAVENOUS
  Filled 2013-10-11: qty 400

## 2013-10-11 MED ORDER — CEFTRIAXONE SODIUM 1 G IJ SOLR: 1.0000 g | INTRAMUSCULAR | Status: DC

## 2013-10-11 MED ORDER — KETOROLAC TROMETHAMINE 30 MG/ML IJ SOLN
30.0000 mg | Freq: Once | INTRAMUSCULAR | Status: AC
  Administered 2013-10-11: 30 mg via INTRAVENOUS
  Filled 2013-10-11: qty 1

## 2013-10-11 MED ORDER — VANCOMYCIN HCL IN DEXTROSE 1-5 GM/200ML-% IV SOLN
1000.0000 mg | Freq: Once | INTRAVENOUS | Status: AC
  Administered 2013-10-11: 1000 mg via INTRAVENOUS
  Filled 2013-10-11: qty 200

## 2013-10-11 MED ORDER — ACETAMINOPHEN 500 MG PO TABS: 500.0000 mg | ORAL_TABLET | Freq: Four times a day (QID) | ORAL | Status: DC | PRN

## 2013-10-11 MED ORDER — ALUM & MAG HYDROXIDE-SIMETH 200-200-20 MG/5ML PO SUSP: 30.0000 mL | Freq: Four times a day (QID) | ORAL | Status: DC | PRN

## 2013-10-11 MED ORDER — CLONIDINE HCL 0.1 MG PO TABS
0.1000 mg | ORAL_TABLET | Freq: Two times a day (BID) | ORAL | Status: DC
  Administered 2013-10-11 – 2013-10-13 (×4): 0.1 mg via ORAL
  Filled 2013-10-11 (×6): qty 1

## 2013-10-11 MED ORDER — SODIUM CHLORIDE 0.9 % IV SOLN
INTRAVENOUS | Status: DC
  Administered 2013-10-11 – 2013-10-13 (×2): via INTRAVENOUS

## 2013-10-11 MED ORDER — INFLUENZA VAC SPLIT QUAD 0.5 ML IM SUSY
0.5000 mL | PREFILLED_SYRINGE | INTRAMUSCULAR | Status: AC
  Administered 2013-10-13: 0.5 mL via INTRAMUSCULAR
  Filled 2013-10-11 (×3): qty 0.5

## 2013-10-11 MED ORDER — ADULT MULTIVITAMIN W/MINERALS CH
1.0000 | ORAL_TABLET | Freq: Every day | ORAL | Status: DC
  Administered 2013-10-11 – 2013-10-13 (×3): 1 via ORAL
  Filled 2013-10-11 (×4): qty 1

## 2013-10-11 MED ORDER — FOLIC ACID 1 MG PO TABS
1.0000 mg | ORAL_TABLET | Freq: Every day | ORAL | Status: DC
  Administered 2013-10-11 – 2013-10-13 (×3): 1 mg via ORAL
  Filled 2013-10-11 (×4): qty 1

## 2013-10-11 MED ORDER — MEDROXYPROGESTERONE ACETATE 150 MG/ML IM SUSP: 150.0000 mg | INTRAMUSCULAR | Status: DC

## 2013-10-11 MED ORDER — ONDANSETRON 8 MG PO TBDP: 4.0000 mg | ORAL_TABLET | Freq: Three times a day (TID) | ORAL | Status: DC | PRN

## 2013-10-11 MED ORDER — ZOLPIDEM TARTRATE 5 MG PO TABS: 5.0000 mg | ORAL_TABLET | Freq: Every evening | ORAL | Status: DC | PRN

## 2013-10-11 MED ORDER — VITAMIN B-1 100 MG PO TABS
100.0000 mg | ORAL_TABLET | Freq: Every day | ORAL | Status: DC
Start: 2013-10-11 — End: 2013-10-13
  Administered 2013-10-11 – 2013-10-13 (×3): 100 mg via ORAL
  Filled 2013-10-11 (×3): qty 1

## 2013-10-11 MED ORDER — FLUCONAZOLE IN SODIUM CHLORIDE 400-0.9 MG/200ML-% IV SOLN
400.0000 mg | INTRAVENOUS | Status: DC
  Administered 2013-10-12 – 2013-10-13 (×2): 400 mg via INTRAVENOUS
  Filled 2013-10-11 (×2): qty 200

## 2013-10-11 MED ORDER — IBUPROFEN 200 MG PO TABS
600.0000 mg | ORAL_TABLET | Freq: Four times a day (QID) | ORAL | Status: DC | PRN
  Administered 2013-10-12: 600 mg via ORAL
  Filled 2013-10-11: qty 3

## 2013-10-11 MED ORDER — QUETIAPINE FUMARATE ER 300 MG PO TB24
300.0000 mg | ORAL_TABLET | Freq: Every day | ORAL | Status: DC
  Administered 2013-10-11 – 2013-10-12 (×2): 300 mg via ORAL
  Filled 2013-10-11 (×4): qty 1

## 2013-10-11 MED ORDER — LORAZEPAM 2 MG/ML IJ SOLN
1.0000 mg | Freq: Once | INTRAMUSCULAR | Status: AC
  Administered 2013-10-11: 1 mg via INTRAVENOUS
  Filled 2013-10-11: qty 1

## 2013-10-11 MED ORDER — HEPARIN SODIUM (PORCINE) 5000 UNIT/ML IJ SOLN
5000.0000 [IU] | Freq: Three times a day (TID) | INTRAMUSCULAR | Status: DC
  Administered 2013-10-11 – 2013-10-13 (×6): 5000 [IU] via SUBCUTANEOUS
  Filled 2013-10-11 (×9): qty 1

## 2013-10-11 NOTE — Progress Notes (Signed)
Utilization Review completed.  Dayvian Blixt RN CM  

## 2013-10-11 NOTE — Progress Notes (Addendum)
ANTIBIOTIC CONSULT NOTE - INITIAL  Pharmacy Consult for fluconazole/vancomycin Indication: fungemia/fever  No Known Allergies  Patient Measurements: Height: 5\' 11"  (180.3 cm) Weight: 190 lb (86.183 kg) IBW/kg (Calculated) : 70.8  Vital Signs: Temp: 99.6 F (37.6 C) (10/05 1905) Temp Source: Oral (10/05 1905) BP: 123/72 mmHg (10/05 1905) Pulse Rate: 108 (10/05 1905) Labs:  Recent Labs  10/09/13 1209 10/11/13 1621  WBC 8.9 11.7*  HGB 11.0* 11.3*  PLT 226 349  CREATININE 0.69 0.69   Estimated Creatinine Clearance: 128.4 ml/min (by C-G formula based on Cr of 0.69).  Medical History: Past Medical History  Diagnosis Date  . Anemia   . Asthma   . Sciatica   . Hypertension   . Constipation     Medications:  Scheduled:  . fluconazole (DIFLUCAN) IV  800 mg Intravenous Once  . [START ON 10/12/2013] Influenza vac split quadrivalent PF  0.5 mL Intramuscular Tomorrow-1000   Infusions:  . cefTRIAXone (ROCEPHIN)  IV 1 g (10/11/13 1930)   Assessment: Vicki Whitehead admitted 10/5 after blood cultures drawn 10/3 showed 1/2 with yeast. Pt seen in ED 10/3 with subjective high fevers, HA. And neck pain. Pt noted to be current heroin user, endorses being HIV positive. Pt was sent home at that time as her work up in the ED was noted to be unremarkable and ashe was to have close f/u with [facility] as an outpt. Pharmacy is consulted to dose fluconazole for fungemia  Antiinfectives 10/5 >> fluconazole >> 10/5 >> ceftriaxone x1  Labs / vitals Tmax: 99.6 WBCs: 11.7 Renal: SCr 0.69, CrCl > 100 ml/min CG/N  Microbiology Previous admit 10/3 blood x2: 1/2 yeast, pending 10/3 flu panel: negative 10/3 Rapid HIV: reactive 10/3 hepatitis panel: HCV Ab reactive 10/3 CG/Chlamydia probe: IP 10/3 RPR: non reactive  This admission 10/5 blood x2: IP 10/5 urine: collected    Goal of Therapy:  fluconazole per indication and renal function Vancomycin trough 15-20  Plan:  - fluconazole 800mg  IV  x1 - fluconazole 400mg  IV q24h to start 10/6 at 1800 - follow-up clinical course, culture results, renal function - follow-up antibiotic de-escalation and length of therapy - Vancomycin 1gm iv q8hr  Thank you for the consult.  Ross LudwigJesse Mack Akers, PharmD, BCPS Pager: [phone] Pharmacy: [phone] 10/11/2013 7:42 PM

## 2013-10-11 NOTE — Telephone Encounter (Signed)
Vicki Whitehead looks like her HIV RNA was undetectable.  I suppose theoretically she could have HIV 2 so probably should get a 4th gen HIV antibody test too but looking like she may not have HIV after all and potential false Positive SUDS  I bet she has Hep C though

## 2013-10-11 NOTE — ED Notes (Signed)
Pt reported she was in the ED 7 days ago and was discharged. Pt states she was advised to return to the ED due to a blood infection. Pt states her pain is currently a 10/10 and is located all over her body and in her "joints". Pt is alert and oriented x 4. Pt family is at bedside. Will continue to monitor. Awaiting orders from provider.

## 2013-10-11 NOTE — ED Notes (Signed)
Pt was sent by PCP for blood infection.

## 2013-10-11 NOTE — Telephone Encounter (Signed)
Informed by Micael HampshireMichelle Howell that patient is fungemic with yeast in 1/2 blood cultures  She will need admission to gen medicine team  She will need to be started on high dose IV fluconazole  She will need repeat blood cultures tomorrow  Given IVDU hx and fungemia without clear source she will need TEE  She will also need Opthalmology consult for dilated fundoscopic exam to rule out fungal infection of the eye

## 2013-10-11 NOTE — ED Notes (Signed)
1st attempt in calling report. Awaiting call back.

## 2013-10-11 NOTE — H&P (Signed)
[facility] History and Physical  Vicki Whitehead QMV:[id number] DOB: 1985/07/21 DOA: 10/11/2013  Referring physician: Mirian Mo, MD PCP: No primary provider on file.   Chief Complaint: Fevers  HPI: Vicki Whitehead is a 28 y.o. female presents with fevers and chills. She states that she uses IVDU and states that she does not share needles. She was recently tested for HIV with a positive screen but a negative confirmation test. She was seen in the ID clinic apparently and also had some blood cultures drawn which have come back for yeast. Patient was told to come into the ED for admission. She states her fevers have been going on for about 7 days. She was seen in the ED for the same reason. She states that she does have chills she has been having some headache also. She states that she has had sensitivity for light but has not had a stiff neck. Apparently her CD4 count was in the normal range. She denies having cough or congestion. She last used drugs about 2 days   Review of Systems:  Constitutional:  No weight loss, ++sweats, ++Fevers, ++chills, ++fatigue.  HEENT:  No headaches, Difficulty swallowing  Cardio-vascular:  No chest pain, Orthopnea, PND, palpitations  GI:  No heartburn, indigestion, abdominal pain, nausea, vomiting, diarrhea  Resp:  No shortness of breath with exertion or at rest. No excess mucus, no productive cough  Skin:  no rash or lesions.  GU:  no dysuria, change in color of urine, no urgency ++frequency Musculoskeletal:  ++joint pain. ++back pain.  Psych:  No change in mood or affect.++ anxiety. No memory loss.   Past Medical History  Diagnosis Date  . Anemia   . Asthma   . Sciatica   . Hypertension   . Constipation    Past Surgical History  Procedure Laterality Date  . Tonsillectomy    . Cesarean section     Social History:  reports that she has been smoking Cigarettes.  She has a 7.5 pack-year smoking history. She has never used smokeless  tobacco. She reports that she drinks alcohol. She reports that she uses illicit drugs (Methamphetamines).  No Known Allergies  History reviewed. No pertinent family history.   Prior to Admission medications   Medication Sig Start Date End Date Taking? Authorizing Provider  acetaminophen (TYLENOL) 500 MG tablet Take 500 mg by mouth every 6 (six) hours as needed for headache.    Yes Historical Provider, MD  albuterol (PROVENTIL HFA;VENTOLIN HFA) 108 (90 BASE) MCG/ACT inhaler Inhale 2 puffs into the lungs every 6 (six) hours as needed for wheezing or shortness of breath.   Yes Historical Provider, MD  cloNIDine (CATAPRES) 0.1 MG tablet Take 0.1 mg by mouth 2 (two) times daily.   Yes Historical Provider, MD  ibuprofen (ADVIL,MOTRIN) 600 MG tablet Take 600 mg by mouth every 6 (six) hours as needed.   Yes Historical Provider, MD  medroxyPROGESTERone (DEPO-PROVERA) 150 MG/ML injection Inject 150 mg into the muscle every 3 (three) months.   Yes Historical Provider, MD  ondansetron (ZOFRAN-ODT) 4 MG disintegrating tablet Take 4 mg by mouth every 8 (eight) hours as needed for nausea or vomiting.   Yes Historical Provider, MD  QUEtiapine (SEROQUEL XR) 300 MG 24 hr tablet Take 300 mg by mouth at bedtime.   Yes Historical Provider, MD   Physical Exam: Filed Vitals:   10/11/13 1535 10/11/13 1905 10/11/13 1931  BP: 135/85 123/72   Pulse: 121 108   Temp: 98.5 F (  36.9 C) 99.6 F (37.6 C)   TempSrc: Oral Oral   Resp: 18 15   Height:   5\' 11"  (1.803 m)  Weight:   86.183 kg (190 lb)  SpO2: 100% 97%     Wt Readings from Last 3 Encounters:  10/11/13 86.183 kg (190 lb)  10/09/13 88.451 kg (195 lb)    General:  Appears calm and comfortable Eyes: PERRL, normal lids, irises & conjunctiva ENT: grossly normal hearing, lips & tongue Neck: no LAD, masses or thyromegaly Cardiovascular: RRR, faint systolic murmer noted likely du to tachycardia. No LE edema Respiratory: CTA bilaterally, no w/r/r. Normal  respiratory effort. Abdomen: soft, ntnd Skin: no rash or induration seen on limited exam Musculoskeletal: grossly normal tone BUE/BLE Psychiatric: grossly normal mood and affect, speech fluent and appropriate Neurologic: grossly non-focal.          Labs on Admission:  Basic Metabolic Panel:  Recent Labs Lab 10/09/13 1209 10/11/13 1621  NA 137 137  K 3.5* 3.7  CL 99 104  CO2 24 19  GLUCOSE 145* 113*  BUN 6 13  CREATININE 0.69 0.69  CALCIUM 9.3 8.8   Liver Function Tests:  Recent Labs Lab 10/09/13 1209 10/11/13 1621  AST 24 20  ALT 36* 24  ALKPHOS 161* 121*  BILITOT 0.5 0.4  PROT 8.1 8.3  ALBUMIN 3.5 3.5    Recent Labs Lab 10/09/13 1209  LIPASE 23   No results found for this basename: AMMONIA,  in the last 168 hours CBC:  Recent Labs Lab 10/09/13 1209 10/11/13 1621  WBC 8.9 11.7*  NEUTROABS 5.9 9.5*  HGB 11.0* 11.3*  HCT 32.1* 33.2*  MCV 81.7 82.2  PLT 226 349   Cardiac Enzymes: No results found for this basename: CKTOTAL, CKMB, CKMBINDEX, TROPONINI,  in the last 168 hours  BNP (last 3 results) No results found for this basename: PROBNP,  in the last 8760 hours CBG: No results found for this basename: GLUCAP,  in the last 168 hours  Radiological Exams on Admission: No results found.   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 Fungemia -yeast in blood cold be contaminant but has high fevers in the setting of a positive HIV screen and negative confirmation test and also IVDU -will start emperic therapy with fluconazole -echo ordered -will get ID consult -also will give a dose of vanc and rocephin -additional cultures drawn in the ED  2. HIV -per notes appear to have a negative confirmation test -she also has a normal CD4 count so less likely to be opportunistic type infections  3. Asthma -will continue with inhalers proair -monitor  response  4. Hypertension -continue with clonidine    Code Status: Full Code (must indicate code status--if unknown or must be presumed, indicate so) DVT Prophylaxis:Heparin Family Communication: None (indicate person spoken with, if applicable, with phone number if by telephone) Disposition Plan: Home (indicate anticipated LOS)  Time spent: 70min  [facility] Community HospitalKHAN,Yafet Cline A [facility] Pager [phone]

## 2013-10-11 NOTE — Telephone Encounter (Signed)
Received alert call from [facility], aerobic culture positive for yeast.  Per Dr. Ninetta LightsHatcher, contacted patient (spoke with mother) and advised she go straight to the ED for evaluation.  Unable to tell patient's mother the lab results, only advised it is urgent that she be seen immediately.  Mother will call patient, get her to the ED.  [facility] will [facility] to either [facility] or [facility].  Andree CossHowell, Oluwateniola Leitch M, RN

## 2013-10-11 NOTE — Telephone Encounter (Signed)
And fungemia!

## 2013-10-11 NOTE — ED Notes (Addendum)
Dr Ninetta LightsHatcher called and stated pt was seen on 10/3. One of her blood cultures came back positive for fungus and patient needs to be admitted. Also stated that you can see his note where HIV test was negative.

## 2013-10-11 NOTE — ED Provider Notes (Signed)
CSN: [id number]     Arrival date & time 10/11/13  1520 History   First MD Initiated Contact with Patient 10/11/13 1813     Chief Complaint  Patient presents with  . Blood Infection     (Consider location/radiation/quality/duration/timing/severity/associated sxs/prior Treatment) Patient is a 28 y.o. female presenting with fever.  Fever Max temp prior to arrival:  103 Temp source:  Oral Severity:  Moderate Onset quality:  Gradual Duration:  7 days Timing:  Constant Progression:  Worsening Chronicity:  New Relieved by:  Nothing Worsened by:  Nothing tried Ineffective treatments:  None tried Associated symptoms: chills, nausea and vomiting   Associated symptoms: no chest pain, no congestion, no cough, no diarrhea, no dysuria, no myalgias, no rash, no rhinorrhea and no sore throat     Past Medical History  Diagnosis Date  . Anemia   . Asthma   . Sciatica   . Hypertension   . Constipation    Past Surgical History  Procedure Laterality Date  . Tonsillectomy    . Cesarean section     History reviewed. No pertinent family history. History  Substance Use Topics  . Smoking status: Current Every Day Smoker -- 0.50 packs/day for 15 years    Types: Cigarettes  . Smokeless tobacco: Never Used  . Alcohol Use: Yes     Comment: "rarely"   OB History   Grav Para Term Preterm Abortions TAB SAB Ect Mult Living                 Review of Systems  Constitutional: Positive for fever and chills.  HENT: Negative for congestion, rhinorrhea and sore throat.   Eyes: Negative for photophobia and visual disturbance.  Respiratory: Negative for cough and shortness of breath.   Cardiovascular: Negative for chest pain and leg swelling.  Gastrointestinal: Positive for nausea and vomiting. Negative for abdominal pain, diarrhea and constipation.  Endocrine: Negative for polyphagia and polyuria.  Genitourinary: Negative for dysuria, flank pain, vaginal bleeding, vaginal discharge and enuresis.   Musculoskeletal: Negative for back pain, gait problem and myalgias.  Skin: Negative for color change and rash.  Neurological: Negative for dizziness, syncope, light-headedness and numbness.  Hematological: Negative for adenopathy. Does not bruise/bleed easily.  All other systems reviewed and are negative.     Allergies  Review of patient's allergies indicates no known allergies.  Home Medications   Prior to Admission medications   Medication Sig Start Date End Date Taking? Authorizing Provider  acetaminophen (TYLENOL) 500 MG tablet Take 500 mg by mouth every 6 (six) hours as needed for headache.    Yes Historical Provider, MD  albuterol (PROVENTIL HFA;VENTOLIN HFA) 108 (90 BASE) MCG/ACT inhaler Inhale 2 puffs into the lungs every 6 (six) hours as needed for wheezing or shortness of breath.   Yes Historical Provider, MD  cloNIDine (CATAPRES) 0.1 MG tablet Take 0.1 mg by mouth 2 (two) times daily.   Yes Historical Provider, MD  ibuprofen (ADVIL,MOTRIN) 600 MG tablet Take 600 mg by mouth every 6 (six) hours as needed.   Yes Historical Provider, MD  medroxyPROGESTERone (DEPO-PROVERA) 150 MG/ML injection Inject 150 mg into the muscle every 3 (three) months.   Yes Historical Provider, MD  ondansetron (ZOFRAN-ODT) 4 MG disintegrating tablet Take 4 mg by mouth every 8 (eight) hours as needed for nausea or vomiting.   Yes Historical Provider, MD  QUEtiapine (SEROQUEL XR) 300 MG 24 hr tablet Take 300 mg by mouth at bedtime.   Yes Historical Provider, MD  BP 107/60  Pulse 94  Temp(Src) 98.5 F (36.9 C) (Oral)  Resp 20  Ht 5\' 11"  (1.803 m)  Wt 193 lb 5.5 oz (87.7 kg)  BMI 26.98 kg/m2  SpO2 100%  LMP 08/09/2013 Physical Exam  Vitals reviewed. Constitutional: She is oriented to person, place, and time. She appears well-developed and well-nourished.  HENT:  Head: Normocephalic and atraumatic.  Right Ear: External ear normal.  Left Ear: External ear normal.  Eyes: Conjunctivae and EOM are  normal. Pupils are equal, round, and reactive to light.  Neck: Normal range of motion. Neck supple.  Cardiovascular: Regular rhythm, normal heart sounds and intact distal pulses.  Tachycardia present.   Pulmonary/Chest: Effort normal and breath sounds normal.  Abdominal: Soft. Bowel sounds are normal. There is no tenderness.  Musculoskeletal: Normal range of motion.  Neurological: She is alert and oriented to person, place, and time.  Skin: Skin is warm and dry.    ED Course  Procedures (including critical care time) Labs Review Labs Reviewed  CBC WITH DIFFERENTIAL - Abnormal; Notable for the following:    WBC 11.7 (*)    Hemoglobin 11.3 (*)    HCT 33.2 (*)    Neutrophils Relative % 81 (*)    Neutro Abs 9.5 (*)    All other components within normal limits  COMPREHENSIVE METABOLIC PANEL - Abnormal; Notable for the following:    Glucose, Bld 113 (*)    Alkaline Phosphatase 121 (*)    All other components within normal limits  URINALYSIS, ROUTINE W REFLEX MICROSCOPIC - Abnormal; Notable for the following:    Leukocytes, UA TRACE (*)    All other components within normal limits  CULTURE, BLOOD (ROUTINE X 2)  CULTURE, BLOOD (ROUTINE X 2)  URINE CULTURE  URINE MICROSCOPIC-ADD ON  TSH  HEMOGLOBIN A1C  COMPREHENSIVE METABOLIC PANEL  CBC    Imaging Review Dg Chest 2 View  10/11/2013   CLINICAL DATA:  Fevers for the past 7 days. IV drug use. The patient describes recent blood cultures that grew yeast. Clinical concern for endocarditis.  EXAM: CHEST  2 VIEW  COMPARISON:  10/09/2013.  FINDINGS: Normal sized heart.  Clear lungs.  Mild scoliosis.  IMPRESSION: No acute abnormality.   Electronically Signed   By: Gordan Payment M.D.   On: 10/11/2013 20:35   Ct Head Wo Contrast  10/11/2013   CLINICAL DATA:  Chills and headache. History of asthma, hypertension, and possible HIV  EXAM: CT HEAD WITHOUT CONTRAST  TECHNIQUE: Contiguous axial images were obtained from the base of the skull through  the vertex without intravenous contrast.  COMPARISON:  None.  FINDINGS: The ventricles are normal in size and position. There is no intracranial hemorrhage nor intracranial mass effect. There is no acute ischemic change. The cerebellum and brainstem are unremarkable.  The observed paranasal sinuses and mastoid air cells are clear. There is no lytic or blastic skull lesion nor skull fracture.  IMPRESSION: There is no acute intracranial abnormality.   Electronically Signed   By: David  [place]   On: 10/11/2013 21:58     EKG Interpretation None      MDM   Final diagnoses:  Fungemia  Asthma, mild intermittent, uncomplicated  HIV 2 (human immunodeficiency virus type 2)  Essential hypertension    28 y.o. 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  Pt was called by  PCP today after blood cultures were positive for yeast.  On arrival today, pt was afebrile, but had taken motrin prior to arrival. Physical exam without obvious source.  Suspect possible endocarditis vs withdrawal from heroin.  Rocephin and fluconazole administered.    1. Fungemia   2. Asthma, mild intermittent, uncomplicated   3. HIV 2 (human immunodeficiency virus type 2)   4. Essential hypertension         Mirian Mo, MD 10/11/13 2241

## 2013-10-12 DIAGNOSIS — F329 Major depressive disorder, single episode, unspecified: Secondary | ICD-10-CM | POA: Diagnosis present

## 2013-10-12 DIAGNOSIS — F1910 Other psychoactive substance abuse, uncomplicated: Secondary | ICD-10-CM | POA: Diagnosis present

## 2013-10-12 DIAGNOSIS — O99320 Drug use complicating pregnancy, unspecified trimester: Secondary | ICD-10-CM | POA: Diagnosis present

## 2013-10-12 DIAGNOSIS — F32A Depression, unspecified: Secondary | ICD-10-CM | POA: Diagnosis present

## 2013-10-12 DIAGNOSIS — E876 Hypokalemia: Secondary | ICD-10-CM

## 2013-10-12 DIAGNOSIS — R509 Fever, unspecified: Secondary | ICD-10-CM

## 2013-10-12 DIAGNOSIS — F319 Bipolar disorder, unspecified: Secondary | ICD-10-CM | POA: Diagnosis present

## 2013-10-12 DIAGNOSIS — R894 Abnormal immunological findings in specimens from other organs, systems and tissues: Secondary | ICD-10-CM

## 2013-10-12 LAB — CBC
HCT: 29.7 % — ABNORMAL LOW (ref 36.0–46.0)
HEMOGLOBIN: 10.1 g/dL — AB (ref 12.0–15.0)
MCH: 27.8 pg (ref 26.0–34.0)
MCHC: 34 g/dL (ref 30.0–36.0)
MCV: 81.8 fL (ref 78.0–100.0)
Platelets: 304 10*3/uL (ref 150–400)
RBC: 3.63 MIL/uL — ABNORMAL LOW (ref 3.87–5.11)
RDW: 13.1 % (ref 11.5–15.5)
WBC: 10.2 10*3/uL (ref 4.0–10.5)

## 2013-10-12 LAB — COMPREHENSIVE METABOLIC PANEL
ALK PHOS: 106 U/L (ref 39–117)
ALT: 21 U/L (ref 0–35)
ANION GAP: 14 (ref 5–15)
AST: 16 U/L (ref 0–37)
Albumin: 3.1 g/dL — ABNORMAL LOW (ref 3.5–5.2)
BUN: 13 mg/dL (ref 6–23)
CO2: 23 mEq/L (ref 19–32)
Calcium: 8.3 mg/dL — ABNORMAL LOW (ref 8.4–10.5)
Chloride: 106 mEq/L (ref 96–112)
Creatinine, Ser: 0.71 mg/dL (ref 0.50–1.10)
GFR calc Af Amer: >90 mL/min (ref >90)
GFR calc non Af Amer: >90 mL/min (ref >90)
Glucose, Bld: 85 mg/dL (ref 70–99)
POTASSIUM: 3.5 meq/L — AB (ref 3.7–5.3)
SODIUM: 143 meq/L (ref 137–147)
TOTAL PROTEIN: 7.2 g/dL (ref 6.0–8.3)
Total Bilirubin: 0.3 mg/dL (ref 0.3–1.2)

## 2013-10-12 LAB — GLUCOSE, CAPILLARY: Glucose-Capillary: 112 mg/dL — ABNORMAL HIGH (ref 70–99)

## 2013-10-12 LAB — URINE CULTURE
CULTURE: NO GROWTH
Colony Count: NO GROWTH

## 2013-10-12 LAB — HEMOGLOBIN A1C
HEMOGLOBIN A1C: 5.6 % (ref <5.7)
Mean Plasma Glucose: 114 mg/dL (ref <117)

## 2013-10-12 LAB — HIV ANTIBODY (ROUTINE TESTING W REFLEX): HIV 1&2 Ab, 4th Generation: NONREACTIVE

## 2013-10-12 LAB — GC/CHLAMYDIA PROBE AMP
CT PROBE, AMP APTIMA: NEGATIVE
GC Probe RNA: NEGATIVE

## 2013-10-12 LAB — TSH: TSH: 0.452 u[IU]/mL (ref 0.350–4.500)

## 2013-10-12 MED ORDER — NICOTINE 14 MG/24HR TD PT24
14.0000 mg | MEDICATED_PATCH | Freq: Every day | TRANSDERMAL | Status: DC
  Administered 2013-10-12 – 2013-10-13 (×2): 14 mg via TRANSDERMAL
  Filled 2013-10-12 (×2): qty 1

## 2013-10-12 MED ORDER — ALPRAZOLAM 1 MG PO TABS
1.0000 mg | ORAL_TABLET | Freq: Three times a day (TID) | ORAL | Status: DC | PRN
  Administered 2013-10-12 – 2013-10-13 (×4): 1 mg via ORAL
  Filled 2013-10-12 (×4): qty 1

## 2013-10-12 MED ORDER — VANCOMYCIN HCL IN DEXTROSE 1-5 GM/200ML-% IV SOLN
1000.0000 mg | Freq: Three times a day (TID) | INTRAVENOUS | Status: DC
  Administered 2013-10-12: 1000 mg via INTRAVENOUS
  Filled 2013-10-12: qty 200

## 2013-10-12 MED ORDER — KETOROLAC TROMETHAMINE 30 MG/ML IJ SOLN
30.0000 mg | Freq: Four times a day (QID) | INTRAMUSCULAR | Status: DC | PRN
  Administered 2013-10-12 – 2013-10-13 (×3): 30 mg via INTRAVENOUS
  Filled 2013-10-12 (×3): qty 1

## 2013-10-12 NOTE — Progress Notes (Signed)
*  PRELIMINARY RESULTS* Echocardiogram 2D Echocardiogram has been performed.  Jeryl ColumbiaLLIOTT, Glendell Schlottman 10/12/2013, 10:20 AM

## 2013-10-12 NOTE — Telephone Encounter (Signed)
[facility]. I think she is close to leaving [facility] again!

## 2013-10-12 NOTE — Progress Notes (Signed)
I attempted to contact Ms Rose FillersScudder to discuss TEE scheduled for tomorrow but was unsuccessful. We will need to consent her in am..   Corine ShelterLUKE Immaculate Crutcher PA-C 10/12/2013 3:32 PM

## 2013-10-12 NOTE — Progress Notes (Addendum)
Patient ID: Vicki Whitehead, female   DOB: 10-06-1985, 28 y.o.   MRN: [id number] [facility] PROGRESS NOTE  Vicki Whitehead DOB: Feb 19, 1985 DOA: 10/11/2013 PCP: No primary provider on file.  Brief narrative: 28 y.o. female with a history of IV drug use, uses heroin, recently seen in ED, 10/09/2013 with fevers, chills, weakness, headaches and overall not feeling well. She was subsequently discharged. Her HIV screen at that time was positive but viral load not detectable. In addition, acute hepatitis panel revealed HCV antibody reactive. Blood cultures were obtained in ED. Infectious disease was consulted at that time and recommendation was to follow with infectious disease outpatient. Blood cultures grew yeast and patient was called to come back for treatment. Repeat blood cultures 10/11/2013 show no growth to date. Infectious disease will see the patient in consultation. Patient is currently on IV fluconazole. Order was also placed for TEE to rule out endocarditis.  Assessment/Plan:    Principal Problem:   Fungemia in patient with history of IV drug use / Fevers, chills  Blood cultures on 10/09/2013 positive for yeast. Follow up final blood culture results. Cultures from 10/11/2013 show no growth to date.  Infectious disease will see the patient in consultation. For now they have recommended continuing fluconazole. Recommendation for TEE as well to rule out endocarditis. Cardiology called to let them know order was placed and if they can schedule TEE inpatient.   Called ophthalmology consult as well Dr. Allena Katz and awaiting response. Active Problems:   Rapid HIV screen reactive / HCV antibody reactive   Rapid HIV screen on 10/09/2013 was reactive. Viral load undetectable. Infectious disease recommended checking HIV antibody.  In regards to acute hepatitis panel and positive HCV antibody, infectious disease recommended checking viral load.    Hypertension  Blood pressure  controlled with clonidine 0.1 mg by mouth twice a day.   Bipolar disorder /  Anxiety and Depression  Ordered Xanax 1 mg every 8 hours as needed for anxiety. Continue Seroquel 300 mg at bedtime for depression.   DVT Prophylaxis   Heparin subcutaneous ordered.  Code Status: Full.  Family Communication:  plan of care discussed with the patient Disposition Plan: Home when stable.    IV Access:   Peripheral IV  Procedures and diagnostic studies:    Dg Chest 2 View 10/11/2013   No acute abnormality.     Ct Head Wo Contrast 10/11/2013  There is no acute intracranial abnormality.     Medical Consultants:   Infectious disease, Dr. Gwen Her [facility] (for TEE)  Other Consultants:   None   Anti-Infectives:   Vancomycin 10/11/2049 --> 10/12/2013  Rocephin 10/11/2013 --> 10/12/2013 Fluconazole 10/11/2013 -->   Manson Passey, MD  [facility] Pager [phone]  If 7PM-7AM, please contact night-coverage [email or web address] Password [email or web address] 10/12/2013, 11:48 AM   LOS: 1 day    HPI/Subjective: No acute overnight events.  Objective: Filed Vitals:   10/11/13 1931 10/11/13 1945 10/11/13 2131 10/12/13 0530  BP:  122/73 107/60 112/72  Pulse:  130 94 73  Temp:   98.5 F (36.9 C) 98.7 F (37.1 C)  TempSrc:   Oral Oral  Resp:  16 20 20   Height: 5\' 11"  (1.803 m)  5\' 11"  (1.803 m)   Weight: 86.183 kg (190 lb)  87.7 kg (193 lb 5.5 oz)   SpO2:  92% 100% 100%    Intake/Output Summary (Last 24 hours) at 10/12/13 1148 Last data filed at 10/12/13 0900  Gross per 24 hour  Intake   1409 ml  Output    450 ml  Net    959 ml    Exam:   General:  Pt is alert, follows commands appropriately, not in acute distress  Cardiovascular: Regular rate and rhythm, S1/S2, no murmurs  Respiratory: Clear to auscultation bilaterally, no wheezing, no crackles, no rhonchi  Abdomen: Soft, non tender, non distended, bowel sounds present  Extremities: No edema, pulses DP and PT palpable  bilaterally  Neuro: Grossly nonfocal  Data Reviewed: Basic Metabolic Panel:  Recent Labs Lab 10/09/13 1209 10/11/13 1621 10/12/13 0505  NA 137 137 143  K 3.5* 3.7 3.5*  CL 99 104 106  CO2 24 19 23   GLUCOSE 145* 113* 85  BUN 6 13 13   CREATININE 0.69 0.69 0.71  CALCIUM 9.3 8.8 8.3*   Liver Function Tests:  Recent Labs Lab 10/09/13 1209 10/11/13 1621 10/12/13 0505  AST 24 20 16   ALT 36* 24 21  ALKPHOS 161* 121* 106  BILITOT 0.5 0.4 0.3  PROT 8.1 8.3 7.2  ALBUMIN 3.5 3.5 3.1*    Recent Labs Lab 10/09/13 1209  LIPASE 23   No results found for this basename: AMMONIA,  in the last 168 hours CBC:  Recent Labs Lab 10/09/13 1209 10/11/13 1621 10/12/13 0505  WBC 8.9 11.7* 10.2  NEUTROABS 5.9 9.5*  --   HGB 11.0* 11.3* 10.1*  HCT 32.1* 33.2* 29.7*  MCV 81.7 82.2 81.8  PLT 226 349 304   Cardiac Enzymes: No results found for this basename: CKTOTAL, CKMB, CKMBINDEX, TROPONINI,  in the last 168 hours BNP: No components found with this basename: POCBNP,  CBG:  Recent Labs Lab 10/12/13 0744  GLUCAP 112*    CULTURE, BLOOD (ROUTINE X 2)     Status: None   Collection Time    10/09/13 12:40 PM      Result Value Ref Range Status   Specimen Description BLOOD RIGHT ARM   Final   Value: YEAST     Note: Gram Stain Report Called to,Read Back By and Verified With: MICHELLE H@12 :06PM ON 10/11/13 BY DANTS     Performed at [facility]   Report Status PENDING   Incomplete  CULTURE, BLOOD (ROUTINE X 2)     Status: None   Collection Time    10/09/13 12:40 PM      Result Value Ref Range Status   Specimen Description BLOOD RIGHT ARM   Final   Value:        BLOOD CULTURE RECEIVED NO GROWTH TO DATE CULTURE WILL BE HELD FOR 5 DAYS BEFORE ISSUING A FINAL NEGATIVE REPORT     Performed at [facility]   Report Status PENDING   Incomplete  GC/CHLAMYDIA PROBE AMP     Status: None   Collection Time    10/09/13  5:00 PM      Result Value Ref Range Status   CT  Probe RNA NEGATIVE  NEGATIVE Final   GC Probe RNA NEGATIVE  NEGATIVE Final  CULTURE, BLOOD (ROUTINE X 2)     Status: None   Collection Time    10/11/13  4:22 PM      Result Value Ref Range Status   Specimen Description BLOOD LEFT HAND   Final   Value:        BLOOD CULTURE RECEIVED NO GROWTH TO DATE CULTURE WILL BE HELD FOR 5 DAYS BEFORE ISSUING A FINAL NEGATIVE REPORT     Performed at [facility]  Report Status PENDING   Incomplete  CULTURE, BLOOD (ROUTINE X 2)     Status: None   Collection Time    10/11/13  4:22 PM      Result Value Ref Range Status   Specimen Description BLOOD LEFT HAND   Final   Value:        BLOOD CULTURE RECEIVED NO GROWTH TO DATE CULTURE WILL BE HELD FOR 5 DAYS BEFORE ISSUING A FINAL NEGATIVE REPORT     [facility]   Report Status PENDING   Incomplete     Scheduled Meds: . cloNIDine  0.1 mg Oral BID  . fluconazole (DIFLUCAN)   400 mg Intravenous Q24H  . folic acid  1 mg Oral Daily  . heparin  5,000 Units Subcutaneous 3 times per day  . multivitamin  1 tablet Oral Daily  . nicotine  14 mg Transdermal Daily  . QUEtiapine  300 mg Oral QHS  . thiamine  100 mg Oral Daily   Continuous Infusions: . sodium chloride 75 mL/hr at 10/11/13 2044

## 2013-10-12 NOTE — Consult Note (Signed)
[facility]    Date of Admission:  10/11/2013  Date of Consult:  10/12/2013  Reason for Consult: fungemia in IVDU Referring Physician: Dr. Charlies Silvers   HPI: Vicki Whitehead is an 28 y.o. female IVDU Who was seen in the ED for fevers, myalgias, severe lower back pain superimposed on chronic back pain. She was seen by my partner Dr. Johnnye Sima over the weekend because the patients HIV SUDS rapid was + and followup was arranged in our clinic. Interestingly her HIV RNA quant is <20 and undetectable. Her HCV ab is also + but we do not have HCV RNA yet.   One of her blood cultures drawn has grown yeast. She has in addition ssx of seeing spots esp when she moves to get out of bed.  She states that she injected heroin as recently as Saturday. She states that she has only ever shared needles with her fiance approx 2 years ago.  Past Medical History  Diagnosis Date  . Anemia   . Asthma   . Sciatica   . Hypertension   . Constipation     Past Surgical History  Procedure Laterality Date  . Tonsillectomy    . Cesarean section    ergies:   No Known Allergies   Medications: I have reviewed patients current medications as documented in Epic Anti-infectives   Start     Dose/Rate Route Frequency Ordered Stop   10/12/13 1800  cefTRIAXone (ROCEPHIN) 1 g in dextrose 5 % 50 mL IVPB  Status:  Discontinued     1 g 100 mL/hr over 30 Minutes Intravenous Every 24 hours 10/11/13 2334 10/12/13 0921   10/12/13 1800  fluconazole (DIFLUCAN) IVPB 400 mg     400 mg 100 mL/hr over 120 Minutes Intravenous Every 24 hours 10/11/13 1943     10/12/13 0600  vancomycin (VANCOCIN) IVPB 1000 mg/200 mL premix  Status:  Discontinued     1,000 mg 200 mL/hr over 60 Minutes Intravenous Every 8 hours 10/12/13 0508 10/12/13 0920   10/11/13 2345  vancomycin (VANCOCIN) IVPB 1000 mg/200 mL premix     1,000 mg 200 mL/hr over 60 Minutes Intravenous  Once 10/11/13 2344 10/12/13 0050   10/11/13 2000   fluconazole (DIFLUCAN) IVPB 800 mg     800 mg 100 mL/hr over 240 Minutes Intravenous  Once 10/11/13 1915 10/12/13 0046   10/11/13 1915  cefTRIAXone (ROCEPHIN) 1 g in dextrose 5 % 50 mL IVPB     1 g 100 mL/hr over 30 Minutes Intravenous  Once 10/11/13 1904 10/11/13 2011      Social History:  reports that she has been smoking Cigarettes.  She has a 7.5 pack-year smoking history. She has never used smokeless tobacco. She reports that she drinks alcohol. She reports that she uses illicit drugs (Methamphetamines).  History reviewed. No pertinent family history.  As in HPI and primary teams notes otherwise 12 point review of systems is negative  Blood pressure 120/78, pulse 74, temperature 98 F (36.7 C), temperature source Oral, resp. rate 18, height _0  (1.803 m), weight 193 lb 5.5 oz (87.7 kg), last menstrual period 08/09/2013, SpO2 99.00%. General: Alert and awake, oriented x3, not in any acute distress. HEENT: anicteric sclera, pupils reactive to light and accommodation, EOMI, oropharynx clear and without exudate CVS regular rate, normal r,  no murmur rubs or gallops Chest: clear to auscultation bilaterally, no wheezing, rales or rhonchi Abdomen: soft nontender, nondistended, normal bowel sounds, Extremities: no  clubbing or edema noted bilaterally. She has some lower back muscular tenderness Neuro: nonfocal, strength and sensation intact   Results for orders placed during the hospital encounter of 10/11/13 (from the past 48 hour(s))  CBC WITH DIFFERENTIAL     Status: Abnormal   Collection Time    10/11/13  4:21 PM      Result Value Ref Range   WBC 11.7 (*) 4.0 - 10.5 K/uL   RBC 4.04  3.87 - 5.11 MIL/uL   Hemoglobin 11.3 (*) 12.0 - 15.0 g/dL   HCT 33.2 (*) 36.0 - 46.0 %   MCV 82.2  78.0 - 100.0 fL   MCH 28.0  26.0 - 34.0 pg   MCHC 34.0  30.0 - 36.0 g/dL   RDW 13.2  11.5 - 15.5 %   Platelets 349  150 - 400 K/uL   Neutrophils Relative % 81 (*) 43 - 77 %   Neutro Abs 9.5 (*)  1.7 - 7.7 K/uL   Lymphocytes Relative 15  12 - 46 %   Lymphs Abs 1.7  0.7 - 4.0 K/uL   Monocytes Relative 3  3 - 12 %   Monocytes Absolute 0.3  0.1 - 1.0 K/uL   Eosinophils Relative 1  0 - 5 %   Eosinophils Absolute 0.1  0.0 - 0.7 K/uL   Basophils Relative 0  0 - 1 %   Basophils Absolute 0.0  0.0 - 0.1 K/uL  COMPREHENSIVE METABOLIC PANEL     Status: Abnormal   Collection Time    10/11/13  4:21 PM      Result Value Ref Range   Sodium 137  137 - 147 mEq/L   Potassium 3.7  3.7 - 5.3 mEq/L   Chloride 104  96 - 112 mEq/L   CO2 19  19 - 32 mEq/L   Glucose, Bld 113 (*) 70 - 99 mg/dL   BUN 13  6 - 23 mg/dL   Creatinine, Ser 0.69  0.50 - 1.10 mg/dL   Calcium 8.8  8.4 - 10.5 mg/dL   Total Protein 8.3  6.0 - 8.3 g/dL   Albumin 3.5  3.5 - 5.2 g/dL   AST 20  0 - 37 U/L   ALT 24  0 - 35 U/L   Alkaline Phosphatase 121 (*) 39 - 117 U/L   Total Bilirubin 0.4  0.3 - 1.2 mg/dL   GFR calc non Af Amer >90  >90 mL/min   GFR calc Af Amer >90  >90 mL/min   Comment: (NOTE)     The eGFR has been calculated using the CKD EPI equation.     This calculation has not been validated in all clinical situations.     eGFR's persistently <90 mL/min signify possible Chronic Kidney     Disease.   Anion gap 14  5 - 15  CULTURE, BLOOD (ROUTINE X 2)     Status: None   Collection Time    10/11/13  4:22 PM      Result Value Ref Range   Specimen Description BLOOD LEFT HAND     Special Requests BOTTLES DRAWN AEROBIC AND ANAEROBIC 2CC     Culture  Setup Time       Value: 10/11/2013 22:54     Performed at [facility]   Culture       Value:        BLOOD CULTURE RECEIVED NO GROWTH TO DATE CULTURE WILL BE HELD FOR 5 DAYS BEFORE ISSUING A FINAL  NEGATIVE REPORT     Performed at [facility]   Report Status PENDING    CULTURE, BLOOD (ROUTINE X 2)     Status: None   Collection Time    10/11/13  4:22 PM      Result Value Ref Range   Specimen Description BLOOD LEFT HAND     Special Requests BOTTLES  DRAWN AEROBIC AND ANAEROBIC 2CC     Culture  Setup Time       Value: 10/11/2013 22:56     Performed at [facility]   Culture       Value:        BLOOD CULTURE RECEIVED NO GROWTH TO DATE CULTURE WILL BE HELD FOR 5 DAYS BEFORE ISSUING A FINAL NEGATIVE REPORT     Performed at [facility]   Report Status PENDING    URINALYSIS, ROUTINE W REFLEX MICROSCOPIC     Status: Abnormal   Collection Time    10/11/13  7:14 PM      Result Value Ref Range   Color, Urine YELLOW  YELLOW   APPearance CLEAR  CLEAR   Specific Gravity, Urine 1.020  1.005 - 1.030   pH 5.5  5.0 - 8.0   Glucose, UA NEGATIVE  NEGATIVE mg/dL   Hgb urine dipstick NEGATIVE  NEGATIVE   Bilirubin Urine NEGATIVE  NEGATIVE   Ketones, ur NEGATIVE  NEGATIVE mg/dL   Protein, ur NEGATIVE  NEGATIVE mg/dL   Urobilinogen, UA 0.2  0.0 - 1.0 mg/dL   Nitrite NEGATIVE  NEGATIVE   Leukocytes, UA TRACE (*) NEGATIVE  URINE MICROSCOPIC-ADD ON     Status: None   Collection Time    10/11/13  7:14 PM      Result Value Ref Range   Squamous Epithelial / LPF RARE  RARE   WBC, UA 0-2  <3 WBC/hpf   Bacteria, UA RARE  RARE   Urine-Other MUCOUS PRESENT    TSH     Status: None   Collection Time    10/11/13  8:51 PM      Result Value Ref Range   TSH 0.452  0.350 - 4.500 uIU/mL   Comment: Performed at [facility]     Status: None   Collection Time    10/11/13  8:51 PM      Result Value Ref Range   Hemoglobin A1C 5.6  <5.7 %   Comment: (NOTE)                                                                               According to the ADA Clinical Practice Recommendations for 2011, when     HbA1c is used as a screening test:      >=6.5%   Diagnostic of Diabetes Mellitus               (if abnormal result is confirmed)     5.7-6.4%   Increased risk of developing Diabetes Mellitus     References:Diagnosis and Classification of Diabetes Mellitus,Diabetes     Care,2011,34(Suppl 1):S62-S69 and Standards of  Medical Care in             Diabetes -  2011,Diabetes OHYW,7371,06 (Suppl 1):S11-S61.   Mean Plasma Glucose 114  <117 mg/dL   Comment: Performed at [facility]     Status: Abnormal   Collection Time    10/12/13  5:05 AM      Result Value Ref Range   Sodium 143  137 - 147 mEq/L   Potassium 3.5 (*) 3.7 - 5.3 mEq/L   Chloride 106  96 - 112 mEq/L   CO2 23  19 - 32 mEq/L   Glucose, Bld 85  70 - 99 mg/dL   BUN 13  6 - 23 mg/dL   Creatinine, Ser 0.71  0.50 - 1.10 mg/dL   Calcium 8.3 (*) 8.4 - 10.5 mg/dL   Total Protein 7.2  6.0 - 8.3 g/dL   Albumin 3.1 (*) 3.5 - 5.2 g/dL   AST 16  0 - 37 U/L   ALT 21  0 - 35 U/L   Alkaline Phosphatase 106  39 - 117 U/L   Total Bilirubin 0.3  0.3 - 1.2 mg/dL   GFR calc non Af Amer >90  >90 mL/min   GFR calc Af Amer >90  >90 mL/min   Comment: (NOTE)     The eGFR has been calculated using the CKD EPI equation.     This calculation has not been validated in all clinical situations.     eGFR's persistently <90 mL/min signify possible Chronic Kidney     Disease.   Anion gap 14  5 - 15  CBC     Status: Abnormal   Collection Time    10/12/13  5:05 AM      Result Value Ref Range   WBC 10.2  4.0 - 10.5 K/uL   RBC 3.63 (*) 3.87 - 5.11 MIL/uL   Hemoglobin 10.1 (*) 12.0 - 15.0 g/dL   HCT 29.7 (*) 36.0 - 46.0 %   MCV 81.8  78.0 - 100.0 fL   MCH 27.8  26.0 - 34.0 pg   MCHC 34.0  30.0 - 36.0 g/dL   RDW 13.1  11.5 - 15.5 %   Platelets 304  150 - 400 K/uL  GLUCOSE, CAPILLARY     Status: Abnormal   Collection Time    10/12/13  7:44 AM      Result Value Ref Range   Glucose-Capillary 112 (*) 70 - 99 mg/dL   Comment 1 Documented in Chart     Comment 2 Notify RN     _0 (sdes,specrequest,cult,reptstatus)   ) Recent Results (from the past 720 hour(s))  CULTURE, BLOOD (ROUTINE X 2)     Status: None   Collection Time    10/09/13 12:40 PM      Result Value Ref Range Status   Specimen Description BLOOD RIGHT  ARM   Final   Special Requests BOTTLES DRAWN AEROBIC AND ANAEROBIC 4CC EACH   Final   Culture  Setup Time     Final   Value: 10/09/2013 20:33     Performed at [facility]   Culture     Final   Value: YEAST     Note: Gram Stain Report Called to,Read Back By and Verified With: MICHELLE H_1 :06PM ON 10/11/13 BY DANTS     Performed at [facility]   Report Status PENDING   Incomplete  CULTURE, BLOOD (ROUTINE X 2)     Status: None   Collection Time    10/09/13 12:40 PM      Result Value Ref  Range Status   Specimen Description BLOOD RIGHT ARM   Final   Special Requests BOTTLES DRAWN AEROBIC AND ANAEROBIC [facility] EACH   Final   Culture  Setup Time     Final   Value: 10/09/2013 20:33     Performed at [facility]   Culture     Final   Value:        BLOOD CULTURE RECEIVED NO GROWTH TO DATE CULTURE WILL BE HELD FOR 5 DAYS BEFORE ISSUING A FINAL NEGATIVE REPORT     Performed at [facility]   Report Status PENDING   Incomplete  GC/CHLAMYDIA PROBE AMP     Status: None   Collection Time    10/09/13  5:00 PM      Result Value Ref Range Status   CT Probe RNA NEGATIVE  NEGATIVE Final   GC Probe RNA NEGATIVE  NEGATIVE Final   Comment: (NOTE)                                                                                               **Normal Reference Range: Negative**          Assay performed using the Gen-Probe APTIMA COMBO2 (R) Assay.     Acceptable specimen types for this assay include APTIMA Swabs (Unisex,     endocervical, urethral, or vaginal), first void urine, and ThinPrep     liquid based cytology samples.     Performed at [facility], BLOOD (ROUTINE X 2)     Status: None   Collection Time    10/11/13  4:22 PM      Result Value Ref Range Status   Specimen Description BLOOD LEFT HAND   Final   Special Requests BOTTLES DRAWN AEROBIC AND ANAEROBIC 2CC   Final   Culture  Setup Time     Final   Value: 10/11/2013 22:54     Performed at  [facility]   Culture     Final   Value:        BLOOD CULTURE RECEIVED NO GROWTH TO DATE CULTURE WILL BE HELD FOR 5 DAYS BEFORE ISSUING A FINAL NEGATIVE REPORT     Performed at [facility]   Report Status PENDING   Incomplete  CULTURE, BLOOD (ROUTINE X 2)     Status: None   Collection Time    10/11/13  4:22 PM      Result Value Ref Range Status   Specimen Description BLOOD LEFT HAND   Final   Special Requests BOTTLES DRAWN AEROBIC AND ANAEROBIC 2CC   Final   Culture  Setup Time     Final   Value: 10/11/2013 22:56     Performed at [facility]   Culture     Final   Value:        BLOOD CULTURE RECEIVED NO GROWTH TO DATE CULTURE WILL BE HELD FOR 5 DAYS BEFORE ISSUING A FINAL NEGATIVE REPORT     Performed at [facility]   Report Status PENDING   Incomplete     Impression/Recommendation  Principal Problem:  Fungemia Active Problems:   HIV 2 (human immunodeficiency virus type 2)   Asthma   Hypertension   IVDA (intravenous drug abuse) complicating pregnancy   Bipolar disorder   Depression   Vicki Whitehead is a 28 y.o. female with  IVDU and candidemia, HIV SUDS + but RNA negative, HCV ab +  #1 Candidemia: While not vegetations were seen on TTE I think she needs TEE to exclude fungal endocarditis. TEE to be set up tomorrow at [facility] --continue high dose IV fluconazole --fu ID of yeast --she also definitely needs formal ophthalmological exam to exclude fungal endophthalmitis esp with her visual ssx  --finally I would  Consider MRI L spine to exclude candidal diskitis, vertebral osteomyelitis  #2 HIV + SUDS: likely a  "false +" test. Would get an HIV antibody 4th generation assay to ensure she does not have HIV 2 and also to check for remote possibility that she is not an "elite controller with HIV 1"  I spent greater than 40 minutes with the patient including greater than 50% of time in face to face counsel of the patient and in coordination of  their care.  #3 HCV + : check HCV RNA and she needs genotype If she has HCV Viremia she will need recheck in several months to see if she has chronic infection or has cleared it  #4 IVDU: needs to be plugged into a  Substance abuse  clinic     10/12/2013, 1:33 PM   Thank you so much for this interesting consult  [place] [facility] [place] [phone] (pager) [phone] (office) 10/12/2013, 1:33 PM  [place] 10/12/2013, 1:33 PM

## 2013-10-13 ENCOUNTER — Ambulatory Visit (HOSPITAL_COMMUNITY): Admission: RE | Admit: 2013-10-13 | Payer: MEDICAID | Source: Ambulatory Visit | Admitting: Internal Medicine

## 2013-10-13 ENCOUNTER — Inpatient Hospital Stay (HOSPITAL_COMMUNITY): Payer: Medicaid Other | Admitting: Anesthesiology

## 2013-10-13 ENCOUNTER — Encounter (HOSPITAL_COMMUNITY)
Admission: EM | Disposition: A | Discharge status: Home / Self Care | Payer: Medicaid Other | Source: Home / Self Care | Attending: Internal Medicine

## 2013-10-13 ENCOUNTER — Encounter (HOSPITAL_COMMUNITY): Payer: Medicaid Other | Admitting: Anesthesiology

## 2013-10-13 ENCOUNTER — Encounter (HOSPITAL_COMMUNITY): Payer: Self-pay | Admitting: *Deleted

## 2013-10-13 DIAGNOSIS — F1990 Other psychoactive substance use, unspecified, uncomplicated: Secondary | ICD-10-CM

## 2013-10-13 DIAGNOSIS — R768 Other specified abnormal immunological findings in serum: Secondary | ICD-10-CM | POA: Diagnosis present

## 2013-10-13 DIAGNOSIS — Z789 Other specified health status: Secondary | ICD-10-CM | POA: Diagnosis present

## 2013-10-13 DIAGNOSIS — F3130 Bipolar disorder, current episode depressed, mild or moderate severity, unspecified: Secondary | ICD-10-CM

## 2013-10-13 DIAGNOSIS — R7881 Bacteremia: Secondary | ICD-10-CM

## 2013-10-13 HISTORY — PX: TEE WITHOUT CARDIOVERSION: SHX5443

## 2013-10-13 LAB — RAPID URINE DRUG SCREEN, HOSP PERFORMED
Amphetamines: NOT DETECTED
Barbiturates: NOT DETECTED
Benzodiazepines: NOT DETECTED
Cocaine: NOT DETECTED
OPIATES: NOT DETECTED
Tetrahydrocannabinol: NOT DETECTED

## 2013-10-13 LAB — GLUCOSE, CAPILLARY: GLUCOSE-CAPILLARY: 96 mg/dL (ref 70–99)

## 2013-10-13 LAB — CULTURE, BLOOD (ROUTINE X 2)

## 2013-10-13 LAB — HIV ANTIBODY (ROUTINE TESTING W REFLEX): HIV 1&2 Ab, 4th Generation: NONREACTIVE

## 2013-10-13 LAB — HEPATITIS C VRS RNA DETECT BY PCR-QUAL: HEPATITIS C VRS RNA BY PCR-QUAL: NEGATIVE

## 2013-10-13 SURGERY — ECHOCARDIOGRAM, TRANSESOPHAGEAL
Anesthesia: Moderate Sedation

## 2013-10-13 MED ORDER — BUTAMBEN-TETRACAINE-BENZOCAINE 2-2-14 % EX AERO
INHALATION_SPRAY | CUTANEOUS | Status: DC | PRN
  Administered 2013-10-13: 2 via TOPICAL

## 2013-10-13 MED ORDER — SODIUM CHLORIDE 0.9 % IJ SOLN: 3.0000 mL | Freq: Two times a day (BID) | INTRAMUSCULAR | Status: DC

## 2013-10-13 MED ORDER — FENTANYL CITRATE 0.05 MG/ML IJ SOLN
INTRAMUSCULAR | Status: DC | PRN
  Administered 2013-10-13 (×2): 25 ug via INTRAVENOUS
  Administered 2013-10-13: 50 ug via INTRAVENOUS

## 2013-10-13 MED ORDER — MIDAZOLAM HCL 10 MG/2ML IJ SOLN
INTRAMUSCULAR | Status: DC | PRN
  Administered 2013-10-13: 1 mg via INTRAVENOUS
  Administered 2013-10-13 (×3): 2 mg via INTRAVENOUS

## 2013-10-13 MED ORDER — LACTATED RINGERS IV SOLN
INTRAVENOUS | Status: DC | PRN
  Administered 2013-10-13: 12:00:00 via INTRAVENOUS

## 2013-10-13 MED ORDER — FLUCONAZOLE 200 MG PO TABS: 400.0000 mg | ORAL_TABLET | Freq: Every day | ORAL | Status: DC

## 2013-10-13 MED ORDER — ONDANSETRON HCL 4 MG/2ML IJ SOLN
INTRAMUSCULAR | Status: DC | PRN
  Administered 2013-10-13: 4 mg via INTRAVENOUS

## 2013-10-13 MED ORDER — KETOROLAC TROMETHAMINE 30 MG/ML IJ SOLN
30.0000 mg | INTRAMUSCULAR | Status: AC
  Administered 2013-10-13: 30 mg via INTRAVENOUS
  Filled 2013-10-13: qty 1

## 2013-10-13 MED ORDER — PROPOFOL 10 MG/ML IV BOLUS
INTRAVENOUS | Status: DC | PRN
  Administered 2013-10-13: 200 mg via INTRAVENOUS

## 2013-10-13 MED ORDER — SUCCINYLCHOLINE CHLORIDE 20 MG/ML IJ SOLN
INTRAMUSCULAR | Status: DC | PRN
  Administered 2013-10-13: 100 mg via INTRAVENOUS

## 2013-10-13 MED ORDER — QUETIAPINE FUMARATE ER 300 MG PO TB24: 300.0000 mg | ORAL_TABLET | Freq: Every day | ORAL | Status: DC

## 2013-10-13 MED ORDER — LIDOCAINE VISCOUS 2 % MT SOLN
OROMUCOSAL | Status: DC | PRN
  Administered 2013-10-13: 12 mL via OROMUCOSAL

## 2013-10-13 NOTE — Transfer of Care (Signed)
Immediate Anesthesia Transfer of Care Note  Patient: Jefm PettyMegan T Malecki  Procedure(s) Performed: Procedure(s): TRANSESOPHAGEAL ECHOCARDIOGRAM (TEE) (N/A)  Patient Location: PACU  Anesthesia Type:General  Level of Consciousness: awake, alert  and oriented  Airway & Oxygen Therapy: Patient Spontanous Breathing and Patient connected to nasal cannula oxygen  Post-op Assessment: Report given to PACU RN, Post -op Vital signs reviewed and stable and Patient moving all extremities X 4  Post vital signs: Reviewed and stable  Complications: No apparent anesthesia complications

## 2013-10-13 NOTE — Discharge Summary (Addendum)
Physician Discharge Summary  Vicki Whitehead:[id number] DOB: 10/27/1985 DOA: 10/11/2013  PCP: No primary provider on file. Referred to [facility]  Admit date: 10/11/2013 Discharge date: 10/13/2013   Recommendations for Outpatient Follow-Up:   1. F/U 2nd set of blood cultures to ensure clearance of fungemia. 2. F/U HIV integrase genotype 3. ID (Dr. Daiva Eves) will also see her in follow up. 4. Recommend outpatient evaluation of HCV RNA/genotype.   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  Diet recommendation: Low sodium, heart healthy.  Carbohydrate-modified.  Regular.   History of Present Illness:   28 y.o. female with a history of IV drug use, uses heroin, recently seen in ED, 10/09/2013 with fevers, chills, weakness, headaches and overall not feeling well. She was subsequently discharged. Her HIV screen at that time was positive but viral load not detectable. In addition, acute hepatitis panel revealed HCV antibody reactive. Blood cultures were obtained in ED. Infectious disease was consulted at that time and recommendation was to follow with infectious disease outpatient. Blood cultures grew yeast and patient was called to come back for treatment. Repeat blood cultures 10/11/2013 show no growth to date.    Hospital Course by Problem:   Principal Problem:   Candida Albicans Fungemia  S/P TTE and TEE with no evidence of IE.  Blood cultures grew 1 of 2 positive for Candida Albicans.  S/P ophthalmologic evaluation by Dr. Allena Katz with no evidence of endophthalmitis.  F/U blood cultures sent 10/13/13.  F/U with ID.  Active  Problems:   HCV antibody positive  Outpatient F/U with ID to check HCV RNA/genotype.    Asthma  Stable. Continue bronchodilators PRN.    Hypertension  Continue Clonidine.    IVDA (intravenous drug abuse)   Counseled.    Bipolar disorder / Depression  Continue Seroquel.    False positive HIV serology  ELISA positive but Western Blot negative and HIV RNA negative.  4th generation Ab HIV test also negative.  Evaluated by ID.    Medical Consultants:    Dr. French Ana, Ophthalmology.  Dr. Paulette Blanch Dam, ID.  Dr. Zoila Shutter, Cardiology.   Discharge Exam:   Filed Vitals:   10/13/13 1620  BP: 148/86  Pulse: 92  Temp: 98.2 F (36.8 C)  Resp: 20   Filed Vitals:   10/13/13 1310 10/13/13 1315 10/13/13 1320 10/13/13 1620  BP: 136/84  140/86 148/86  Pulse: 68 82 96 92  Temp:    98.2 F (36.8 C)  TempSrc:    Oral  Resp: 24 27 41 20  Height:      Weight:      SpO2: 100% 100% 100% 100%    Gen:  NAD Cardiovascular:  RRR, No M/R/G Respiratory: Lungs CTAB Gastrointestinal: Abdomen soft, NT/ND with normal active bowel sounds. Extremities: No C/E/C   The results of significant diagnostics from this hospitalization (including imaging, microbiology, ancillary and laboratory) are listed below for reference.     Procedures and Diagnostic Studies:   Dg Chest 2 View  10/11/2013: No acute abnormality.    Ct Head Wo Contrast 10/11/2013: There is no acute  intracranial abnormality.     2 D Echocardiogram 10/12/13: EF 60%.  No vegetations seen.  Trans-thoracic Echocardiogram 10/13/13: No evidence of endocarditis.   Labs:   Basic Metabolic Panel:  Recent Labs Lab 10/09/13 1209 10/11/13 1621 10/12/13 0505  NA 137 137 143  K 3.5* 3.7 3.5*  CL 99 104 106  CO2 24 19 23   GLUCOSE 145* 113* 85  BUN 6 13 13   CREATININE 0.69 0.69 0.71  CALCIUM 9.3 8.8 8.3*   GFR Estimated Creatinine Clearance: 129.4 ml/min (by C-G formula based on Cr of 0.71). Liver  Function Tests:  Recent Labs Lab 10/09/13 1209 10/11/13 1621 10/12/13 0505  AST 24 20 16   ALT 36* 24 21  ALKPHOS 161* 121* 106  BILITOT 0.5 0.4 0.3  PROT 8.1 8.3 7.2  ALBUMIN 3.5 3.5 3.1*    Recent Labs Lab 10/09/13 1209  LIPASE 23   CBC:  Recent Labs Lab 10/09/13 1209 10/11/13 1621 10/12/13 0505  WBC 8.9 11.7* 10.2  NEUTROABS 5.9 9.5*  --   HGB 11.0* 11.3* 10.1*  HCT 32.1* 33.2* 29.7*  MCV 81.7 82.2 81.8  PLT 226 349 304   CBG:  Recent Labs Lab 10/12/13 0744 10/13/13 0728  GLUCAP 112* 96   Hgb A1c  Recent Labs  10/11/13 2051  HGBA1C 5.6   Thyroid function studies  Recent Labs  10/11/13 2051  TSH 0.452   Microbiology Recent Results (from the past 240 hour(s))  CULTURE, BLOOD (ROUTINE X 2)     Status: None   Collection Time    10/09/13 12:40 PM      Result Value Ref Range Status   Specimen Description BLOOD RIGHT ARM   Final   Special Requests BOTTLES DRAWN AEROBIC AND ANAEROBIC 4CC EACH   Final   Culture  Setup Time     Final   Value: 10/09/2013 20:33     Performed at [facility]   Culture     Final   Value: CANDIDA ALBICANS     Note: Gram Stain Report Called to,Read Back By and Verified With: MICHELLE H@12 :06PM ON 10/11/13 BY DANTS     Performed at [facility]   Report Status 10/13/2013 FINAL   Final  CULTURE, BLOOD (ROUTINE X 2)     Status: None   Collection Time    10/09/13 12:40 PM      Result Value Ref Range Status   Specimen Description BLOOD RIGHT ARM   Final   Special Requests BOTTLES DRAWN AEROBIC AND ANAEROBIC [facility]3CC EACH   Final   Culture  Setup Time     Final   Value: 10/09/2013 20:33     Performed at [facility]   Culture     Final   Value:        BLOOD CULTURE RECEIVED NO GROWTH TO DATE CULTURE WILL BE HELD FOR 5 DAYS BEFORE ISSUING A FINAL NEGATIVE REPORT     Performed at [facility]   Report Status PENDING   Incomplete  GC/CHLAMYDIA PROBE AMP     Status: None   Collection Time     10/09/13  5:00 PM      Result Value Ref Range Status   CT Probe RNA NEGATIVE  NEGATIVE Final   GC Probe RNA NEGATIVE  NEGATIVE Final   Comment: (NOTE)                                                                                               **  Normal Reference Range: Negative**          Assay performed using the Gen-Probe APTIMA COMBO2 (R) Assay.     Acceptable specimen types for this assay include APTIMA Swabs (Unisex,     endocervical, urethral, or vaginal), first void urine, and ThinPrep     liquid based cytology samples.     Performed at [facility]  CULTURE, BLOOD (ROUTINE X 2)     Status: None   Collection Time    10/11/13  4:22 PM      Result Value Ref Range Status   Specimen Description BLOOD LEFT HAND   Final   Special Requests BOTTLES DRAWN AEROBIC AND ANAEROBIC 2CC   Final   Culture  Setup Time     Final   Value: 10/11/2013 22:54     Performed at [facility]   Culture     Final   Value:        BLOOD CULTURE RECEIVED NO GROWTH TO DATE CULTURE WILL BE HELD FOR 5 DAYS BEFORE ISSUING A FINAL NEGATIVE REPORT     Performed at [facility]   Report Status PENDING   Incomplete  CULTURE, BLOOD (ROUTINE X 2)     Status: None   Collection Time    10/11/13  4:22 PM      Result Value Ref Range Status   Specimen Description BLOOD LEFT HAND   Final   Special Requests BOTTLES DRAWN AEROBIC AND ANAEROBIC 2CC   Final   Culture  Setup Time     Final   Value: 10/11/2013 22:56     Performed at [facility]   Culture     Final   Value: YEAST     Note: Gram Stain Report Called to,Read Back By and Verified With: MICHELLE H@12 :06PM ON 10/11/13 BY DANTS     Performed at [facility]   Report Status PENDING   Incomplete  URINE CULTURE     Status: None   Collection Time    10/11/13  7:14 PM      Result Value Ref Range Status   Specimen Description URINE, CLEAN CATCH   Final   Special Requests NONE   Final   Culture  Setup Time     Final    Value: 10/12/2013 03:29     Performed at [facility] Count     Final   Value: NO GROWTH     Performed at [facility]   Culture     Final   Value: NO GROWTH     Performed at [facility]   Report Status 10/12/2013 FINAL   Final     Discharge Instructions:       Discharge Instructions   Activity as tolerated - No restrictions    Complete by:  As directed      Call MD for:  difficulty breathing, headache or visual disturbances    Complete by:  As directed      Call MD for:  extreme fatigue    Complete by:  As directed      Call MD for:  temperature >100.4    Complete by:  As directed      Diet general    Complete by:  As directed      Discharge instructions    Complete by:  As directed   Take the diflucan exactly as prescribed until all medication is gone.  Failure to do so  can result in severe, life threatening infection.            Medication List         acetaminophen 500 MG tablet  Commonly known as:  TYLENOL  Take 500 mg by mouth every 6 (six) hours as needed for headache.     albuterol 108 (90 BASE) MCG/ACT inhaler  Commonly known as:  PROVENTIL HFA;VENTOLIN HFA  Inhale 2 puffs into the lungs every 6 (six) hours as needed for wheezing or shortness of breath.     cloNIDine 0.1 MG tablet  Commonly known as:  CATAPRES  Take 0.1 mg by mouth 2 (two) times daily.     fluconazole 200 MG tablet  Commonly known as:  DIFLUCAN  Take 2 tablets (400 mg total) by mouth daily.     ibuprofen 600 MG tablet  Commonly known as:  ADVIL,MOTRIN  Take 600 mg by mouth every 6 (six) hours as needed.     medroxyPROGESTERone 150 MG/ML injection  Commonly known as:  DEPO-PROVERA  Inject 150 mg into the muscle every 3 (three) months.     ondansetron 4 MG disintegrating tablet  Commonly known as:  ZOFRAN-ODT  Take 4 mg by mouth every 8 (eight) hours as needed for nausea or vomiting.     QUEtiapine 300 MG 24 hr tablet  Commonly known as:   SEROQUEL XR  Take 1 tablet (300 mg total) by mouth at bedtime.       Follow-up Information   Follow up with [facility]    . Schedule an appointment as soon as possible for a visit in 2 weeks. [facility] follow up.)    Contact information:   [address] [place] [address] [phone]      Follow up with Acey Lav, MD. Schedule an appointment as soon as possible for a visit in 3 weeks.   Specialty:  Infectious Diseases   Contact information:   [address] [place] [address] [phone]        Time coordinating discharge: 35 minutes.  Signed:  RAMA,CHRISTINA  Pager [phone] [facility] 10/13/2013, 8:29 PM

## 2013-10-13 NOTE — Progress Notes (Signed)
Patient wanting to know when Dr. Allena KatzPatel will be here to examine her.  I called Dr. Allena KatzPatel and left voicemail.  Dr. Allena KatzPatel returned my call and stated she will be here this evening to examine patient.  Patient was informed of this.  Allayne ButcherMiller, Melanie [facility]  10/13/2013

## 2013-10-13 NOTE — Progress Notes (Signed)
Patient left the unit at approximately 2045 for home with spouse. Discharge instructions reviewed with patient and she voiced understanding of same. No s&s of distress noted.

## 2013-10-13 NOTE — Progress Notes (Signed)
  Echocardiogram 2D Echocardiogram has been performed.  Arvil ChacoFoster, Verneta Hamidi 10/13/2013, 12:57 PM

## 2013-10-13 NOTE — Anesthesia Preprocedure Evaluation (Addendum)
Anesthesia Evaluation  Patient identified by MRN, date of birth, ID band Patient awake    Reviewed: Allergy & Precautions, H&P , NPO status , Patient's Chart, lab work & pertinent test results  Airway Mallampati: I TM Distance: >3 FB Neck ROM: full    Dental  (+) Teeth Intact, Dental Advidsory Given   Pulmonary Current Smoker,  breath sounds clear to auscultation        Cardiovascular hypertension, Rhythm:Regular Rate:Normal     Neuro/Psych PSYCHIATRIC DISORDERS  Neuromuscular disease    GI/Hepatic   Endo/Other    Renal/GU      Musculoskeletal   Abdominal   Peds  Hematology  (+) anemia ,   Anesthesia Other Findings   Reproductive/Obstetrics                          Anesthesia Physical Anesthesia Plan  ASA: III  Anesthesia Plan: General ETT and General   Post-op Pain Management:    Induction: Intravenous  Airway Management Planned: Oral ETT  Additional Equipment:   Intra-op Plan:   Post-operative Plan:   Informed Consent:   Dental Advisory Given  Plan Discussed with: Anesthesiologist, CRNA and Surgeon  Anesthesia Plan Comments:        Anesthesia Quick Evaluation

## 2013-10-13 NOTE — H&P (Signed)
     INTERVAL PROCEDURE H&P  History and Physical Interval Note:  10/13/2013 11:38 AM  Vicki Whitehead has presented today for their planned procedure. The various methods of treatment have been discussed with the patient and family. After consideration of risks, benefits and other options for treatment, the patient has consented to the procedure.  The patients' outpatient history has been reviewed, patient examined, and no change in status from most recent office note within the past 30 days. I have reviewed the patients' chart and labs and will proceed as planned. Questions were answered to the patient's satisfaction.   Chrystie NoseKenneth C. Orval Dortch, MD, [facility]ACC Attending Cardiologist [facility]  Vicki Whitehead C 10/13/2013, 11:38 AM

## 2013-10-13 NOTE — Anesthesia Procedure Notes (Signed)
Procedure Name: Intubation Date/Time: 10/13/2013 12:15 PM Performed by: Carmela RimaMARTINELLI, Sloane Junkin F Pre-anesthesia Checklist: Patient identified, Timeout performed, Emergency Drugs available, Suction available and Patient being monitored Patient Re-evaluated:Patient Re-evaluated prior to inductionOxygen Delivery Method: Circle system utilized Preoxygenation: Pre-oxygenation with 100% oxygen Intubation Type: IV induction Ventilation: Mask ventilation without difficulty Laryngoscope Size: Mac and 3 Grade View: Grade I Tube type: Oral Tube size: 7.5 mm Number of attempts: 1 Placement Confirmation: positive ETCO2,  breath sounds checked- equal and bilateral and ETT inserted through vocal cords under direct vision Secured at: 21 cm Tube secured with: Tape Dental Injury: Teeth and Oropharynx as per pre-operative assessment

## 2013-10-13 NOTE — Progress Notes (Signed)
[facility]  Patient requesting to see MD and wanting to go home.  Dr. Darnelle Catalanama notified.  Patient still needs to have opthalmascopic examination by Dr. Allena KatzPatel.  Dr. Darnelle Catalanama strongly encouraged patient to wait for examination and not to leave AMA if possible.  I informed patient and she agreed to stay for now.  Allayne ButcherMiller, Tamekia Rotter [facility]  10/13/2013

## 2013-10-13 NOTE — Care Management Note (Signed)
CARE MANAGEMENT NOTE 10/13/2013  Patient:  Vicki Whitehead, Vicki Whitehead   Account Number:  [id number]  Date Initiated:  10/13/2013  Documentation initiated by:  Marney Doctor  Subjective/Objective Assessment:   28 yo female admitted with Fungemia     Action/Plan:   From home with family   Anticipated DC Date:  10/18/2013   Anticipated DC Plan:  [place]  CM consult      Choice offered to / List presented to:             Status of service:  In process, will continue to follow Medicare Important Message given?   (If response is "NO", the following Medicare IM given date fields will be blank) Date Medicare IM given:   Medicare IM given by:   Date Additional Medicare IM given:   Additional Medicare IM given by:    Discharge Disposition:    Per UR Regulation:  Reviewed for med. necessity/level of care/duration of stay  If discussed at [place] of Stay Meetings, dates discussed:    Comments:  10/13/13 Marney Doctor RN,BSN,NCM Met with pt to assess DC needs.  Pt does not have a PCP or insurance.  Pt given [place] information and CM will help her get a f/u appointment closer to DC.  Pt informed that when she has an appointment set up she can go directly over to the clinic at DC and receive assistance with her medications.  CM will continue to follow.

## 2013-10-13 NOTE — CV Procedure (Signed)
    TRANSESOPHAGEAL ECHOCARDIOGRAM (TEE) NOTE  INDICATIONS: fungemia  PROCEDURE:   Informed consent was obtained prior to the procedure. The risks, benefits and alternatives for the procedure were discussed and the patient comprehended these risks.  Risks include, but are not limited to, cough, sore throat, vomiting, nausea, somnolence, esophageal and stomach trauma or perforation, bleeding, low blood pressure, aspiration, pneumonia, infection, trauma to the teeth and death.    After a procedural time-out, the patient was given 7 mg versed and 100 mcg fentanyl for moderate sedation - however, no sedation was achieved.  Given the history of prior IVDU, there is concern for high tolerance. D/W anesthesia and decided to proceed with elective intubation and propofol sedation (please see their note). The oropharynx was anesthetized 10 cc of topical 1% viscous lidocaine and 2 cetacaine sprays.  The transesophageal probe was inserted in the esophagus and stomach without difficulty and multiple views were obtained.  The patient was kept under observation until the patient left the procedure room.  The patient left the procedure room in stable condition.   Agitated microbubble saline contrast was administered.  COMPLICATIONS:    There were no immediate complications.  Findings:  1. LEFT VENTRICLE: The left ventricular wall thickness is normal.  The left ventricular cavity is normal in size. Wall motion is normal.  LVEF is 55-60%.  2. RIGHT VENTRICLE:  The right ventricle is normal in structure and function without any thrombus or masses.    3. LEFT ATRIUM:  The left atrium is normal in size without any thrombus or masses.  There is not spontaneous echo contrast ("smoke") in the left atrium consistent with a low flow state.  4. LEFT ATRIAL APPENDAGE:  The left atrial appendage is free of any thrombus or masses. The appendage has single lobes. Pulse doppler indicates moderate flow in the  appendage.  5. ATRIAL SEPTUM:  The atrial septum appears intact and is free of thrombus and/or masses.  There is no evidence for interatrial shunting by color doppler and saline microbubble.  6. RIGHT ATRIUM:  The right atrium is normal in size and function without any thrombus or masses.  7. MITRAL VALVE:  The mitral valve is normal in structure and function with trivial regurgitation.  There were no vegetations or stenosis.  8. AORTIC VALVE:  The aortic valve is trileaflet, normal in structure and function with no regurgitation.  There were no vegetations or stenosis  9. TRICUSPID VALVE:  The tricuspid valve is normal in structure and function with trivial regurgitation.  There were no vegetations or stenosis  10.  PULMONIC VALVE:  The pulmonic valve is normal in structure and function with no regurgitation.  There were no vegetations or stenosis.   11. AORTIC ARCH, ASCENDING AND DESCENDING AORTA:  There was no Myrtis Ser(Katz et. Al, 1992) atherosclerosis of the ascending aorta, aortic arch, or proximal descending aorta.  12. PULMONARY VEINS: Anomalous pulmonary venous return was not noted.  13. PERICARDIUM: The pericardium appeared normal and non-thickened.  There is no pericardial effusion.  IMPRESSION:   1. No evidence for fungal endocarditis. 2. No LAA thrombus. 3. Normal LV function.  RECOMMENDATIONS:    1.  Further treatment of fungemia per ID service.  Time Spent Directly with the Patient:  60 minutes  Chrystie NoseKenneth C. Salisha Bardsley, MD, [facility]ACC Attending Cardiologist [facility]  10/13/2013, 12:37 PM

## 2013-10-13 NOTE — Progress Notes (Signed)
[facility]  Day #2 fluconazole   Subjective: No new complaints   Antibiotics:  Anti-infectives   Start     Dose/Rate Route Frequency Ordered Stop   10/12/13 1800  cefTRIAXone (ROCEPHIN) 1 g in dextrose 5 % 50 mL IVPB  Status:  Discontinued     1 g 100 mL/hr over 30 Minutes Intravenous Every 24 hours 10/11/13 2334 10/12/13 0921   10/12/13 1800  fluconazole (DIFLUCAN) IVPB 400 mg     400 mg 100 mL/hr over 120 Minutes Intravenous Every 24 hours 10/11/13 1943     10/12/13 0600  vancomycin (VANCOCIN) IVPB 1000 mg/200 mL premix  Status:  Discontinued     1,000 mg 200 mL/hr over 60 Minutes Intravenous Every 8 hours 10/12/13 0508 10/12/13 0920   10/11/13 2345  vancomycin (VANCOCIN) IVPB 1000 mg/200 mL premix     1,000 mg 200 mL/hr over 60 Minutes Intravenous  Once 10/11/13 2344 10/12/13 0050   10/11/13 2000  fluconazole (DIFLUCAN) IVPB 800 mg     800 mg 100 mL/hr over 240 Minutes Intravenous  Once 10/11/13 1915 10/12/13 0046   10/11/13 1915  cefTRIAXone (ROCEPHIN) 1 g in dextrose 5 % 50 mL IVPB     1 g 100 mL/hr over 30 Minutes Intravenous  Once 10/11/13 1904 10/11/13 2011      Medications: Scheduled Meds: . cloNIDine  0.1 mg Oral BID  . fluconazole (DIFLUCAN) IV  400 mg Intravenous Q24H  . folic acid  1 mg Oral Daily  . heparin  5,000 Units Subcutaneous 3 times per day  . [START ON 11/18/2013] medroxyPROGESTERone  150 mg Intramuscular Q90 days  . multivitamin with minerals  1 tablet Oral Daily  . nicotine  14 mg Transdermal Daily  . QUEtiapine  300 mg Oral QHS  . sodium chloride  3 mL Intravenous Q12H  . thiamine  100 mg Oral Daily   Continuous Infusions:  PRN Meds:.acetaminophen, albuterol, ALPRAZolam, alum & mag hydroxide-simeth, ibuprofen, ketorolac, ondansetron, zolpidem    Objective: Weight change:   Intake/Output Summary (Last 24 hours) at 10/13/13 1825 Last data filed at 10/13/13 1822  Gross per 24 hour  Intake 2131.25 ml    Output    300 ml  Net 1831.25 ml   Blood pressure 148/86, pulse 92, temperature 98.2 F (36.8 C), temperature source Oral, resp. rate 20, height 5\' 11"  (1.803 m), weight 193 lb 5.5 oz (87.7 kg), last menstrual period 08/09/2013, SpO2 100.00%. Temp:  [97.7 F (36.5 C)-98.6 F (37 C)] 98.2 F (36.8 C) (10/07 1620) Pulse Rate:  [63-111] 92 (10/07 1620) Resp:  [11-41] 20 (10/07 1620) BP: (121-154)/(79-109) 148/86 mmHg (10/07 1620) SpO2:  [98 %-100 %] 100 % (10/07 1620)  Physical Exam: General: Alert and awake, oriented x3, not in any acute distress.  HEENT: anicteric sclera, pupils reactive to light and accommodation, EOMI, oropharynx clear and without exudate  CVS regular rate, normal r, no murmur rubs or gallops  Chest: clear to auscultation bilaterally, no wheezing, rales or rhonchi  Abdomen: soft nontender, nondistended, normal bowel sounds,  Extremities: no clubbing or edema noted bilaterally. She has some lower back muscular tenderness  Neuro: nonfocal, strength and sensation intact   CBC:  Recent Labs Lab 10/09/13 1209 10/11/13 1621 10/12/13 0505  HGB 11.0* 11.3* 10.1*  HCT 32.1* 33.2* 29.7*  PLT 226 349 304     BMET  Recent Labs  10/11/13 1621 10/12/13 0505  NA 137 143  K 3.7 3.5*  CL 104 106  CO2 19 23  GLUCOSE 113* 85  Whitehead 13 13  CREATININE 0.69 0.71  CALCIUM 8.8 8.3*     Liver Panel   Recent Labs  10/11/13 1621 10/12/13 0505  PROT 8.3 7.2  ALBUMIN 3.5 3.1*  AST 20 16  ALT 24 21  ALKPHOS 121* 106  BILITOT 0.4 0.3       Sedimentation Rate No results found for this basename: ESRSEDRATE,  in the last 72 hours C-Reactive Protein No results found for this basename: CRP,  in the last 72 hours  Micro Results: Recent Results (from the past 720 hour(s))  CULTURE, BLOOD (ROUTINE X 2)     Status: None   Collection Time    10/09/13 12:40 PM      Result Value Ref Range Status   Specimen Description BLOOD RIGHT ARM   Final   Special  Requests BOTTLES DRAWN AEROBIC AND ANAEROBIC 4CC EACH   Final   Culture  Setup Time     Final   Value: 10/09/2013 20:33     Performed at [facility]   Culture     Final   Value: CANDIDA ALBICANS     Note: Gram Stain Report Called to,Read Back By and Verified With: MICHELLE H@12 :06PM ON 10/11/13 BY DANTS     Performed at [facility]   Report Status 10/13/2013 FINAL   Final  CULTURE, BLOOD (ROUTINE X 2)     Status: None   Collection Time    10/09/13 12:40 PM      Result Value Ref Range Status   Specimen Description BLOOD RIGHT ARM   Final   Special Requests BOTTLES DRAWN AEROBIC AND ANAEROBIC [facility] EACH   Final   Culture  Setup Time     Final   Value: 10/09/2013 20:33     Performed at [facility]   Culture     Final   Value:        BLOOD CULTURE RECEIVED NO GROWTH TO DATE CULTURE WILL BE HELD FOR 5 DAYS BEFORE ISSUING A FINAL NEGATIVE REPORT     Performed at [facility]   Report Status PENDING   Incomplete  GC/CHLAMYDIA PROBE AMP     Status: None   Collection Time    10/09/13  5:00 PM      Result Value Ref Range Status   CT Probe RNA NEGATIVE  NEGATIVE Final   GC Probe RNA NEGATIVE  NEGATIVE Final   Comment: (NOTE)                                                                                               **Normal Reference Range: Negative**          Assay performed using the Gen-Probe APTIMA COMBO2 (R) Assay.     Acceptable specimen types for this assay include APTIMA Swabs (Unisex,     endocervical, urethral, or vaginal), first void urine, and ThinPrep     liquid based cytology samples.     Performed at [facility]  CULTURE, BLOOD (ROUTINE X 2)  Status: None   Collection Time    10/11/13  4:22 PM      Result Value Ref Range Status   Specimen Description BLOOD LEFT HAND   Final   Special Requests BOTTLES DRAWN AEROBIC AND ANAEROBIC 2CC   Final   Culture  Setup Time     Final   Value: 10/11/2013 22:54     Performed at [facility]   Culture     Final   Value:        BLOOD CULTURE RECEIVED NO GROWTH TO DATE CULTURE WILL BE HELD FOR 5 DAYS BEFORE ISSUING A FINAL NEGATIVE REPORT     Performed at [facility]   Report Status PENDING   Incomplete  CULTURE, BLOOD (ROUTINE X 2)     Status: None   Collection Time    10/11/13  4:22 PM      Result Value Ref Range Status   Specimen Description BLOOD LEFT HAND   Final   Special Requests BOTTLES DRAWN AEROBIC AND ANAEROBIC 2CC   Final   Culture  Setup Time     Final   Value: 10/11/2013 22:56     Performed at [facility]   Culture     Final   Value: YEAST     Note: Gram Stain Report Called to,Read Back By and Verified With: MICHELLE H@12 :06PM ON 10/11/13 BY DANTS     Performed at [facility]   Report Status PENDING   Incomplete  URINE CULTURE     Status: None   Collection Time    10/11/13  7:14 PM      Result Value Ref Range Status   Specimen Description URINE, CLEAN CATCH   Final   Special Requests NONE   Final   Culture  Setup Time     Final   Value: 10/12/2013 03:29     Performed at [facility] Count     Final   Value: NO GROWTH     Performed at [facility]   Culture     Final   Value: NO GROWTH     Performed at [facility]   Report Status 10/12/2013 FINAL   Final    Studies/Results: Dg Chest 2 View  10/11/2013   CLINICAL DATA:  Fevers for the past 7 days. IV drug use. The patient describes recent blood cultures that grew yeast. Clinical concern for endocarditis.  EXAM: CHEST  2 VIEW  COMPARISON:  10/09/2013.  FINDINGS: Normal sized heart.  Clear lungs.  Mild scoliosis.  IMPRESSION: No acute abnormality.   Electronically Signed   By: Gordan Payment M.D.   On: 10/11/2013 20:35   Ct Head Wo Contrast  10/11/2013   CLINICAL DATA:  Chills and headache. History of asthma, hypertension, and possible HIV  EXAM: CT HEAD WITHOUT CONTRAST  TECHNIQUE: Contiguous axial images were obtained from the base  of the skull through the vertex without intravenous contrast.  COMPARISON:  None.  FINDINGS: The ventricles are normal in size and position. There is no intracranial hemorrhage nor intracranial mass effect. There is no acute ischemic change. The cerebellum and brainstem are unremarkable.  The observed paranasal sinuses and mastoid air cells are clear. There is no lytic or blastic skull lesion nor skull fracture.  IMPRESSION: There is no acute intracranial abnormality.   Electronically Signed   By: David  [place]   On: 10/11/2013 21:58      Assessment/Plan:  Principal Problem:   Fungemia Active Problems:   HIV 2 (human immunodeficiency virus type 2)   Asthma   Hypertension   IVDA (intravenous drug abuse) complicating pregnancy   Bipolar disorder   Depression    Vicki Whitehead is a 28 y.o. 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  TEE is clean  Patient not surprisingly was still fungemic on admission  --repeat blood cultures x 2 today --she also definitely needs formal ophthalmological exam to exclude fungal endophthalmitis esp with her visual ssx  --finally I would Consider MRI L spine to exclude candidal diskitis, vertebral osteomyelitis   --if she has no fungal endophthalmitis or deep infection beyond fungemia she should get better with 14 day course of fluconazole with day #1 being day of first negative blood cultures (hopefully today)   --we can check surveillance blood cultures 2 weeks after she finishes her fluconazole course in our ID clinic  #2 HIV + SUDS with HIV RNA negative and 4th gen Ab HIV test negative = false +  .  #3 HCV + : check HCV RNA and she needs genotype checked as well  If she has HCV Viremia she will need recheck in several months to see if she has chronic infection or has cleared it   #4 IVDU: needs to be plugged into a Substance abuse  clinic       LOS: 2 days   Acey Lav 10/13/2013, 6:25 PM

## 2013-10-13 NOTE — Discharge Instructions (Signed)
Opioid Withdrawal Opioids are a group of narcotic drugs. They include the street drug heroin. They also include pain medicines, such as morphine, hydrocodone, oxycodone, and fentanyl. Opioid withdrawal is a group of characteristic physical and mental signs and symptoms. It typically occurs if you have been using opioids daily for several weeks or longer and stop using or rapidly decrease use. Opioid withdrawal can also occur if you have used opioids daily for a long time and are given a medicine to block the effect.  SIGNS AND SYMPTOMS Opioid withdrawal includes three or more of the following symptoms:   Depressed, anxious, or irritable mood.  Nausea or vomiting.  Muscle aches or spasms.   Watery eyes.   Runny nose.  Dilated pupils, sweating, or hairs standing on end.  Diarrhea or intestinal cramping.  Yawning.   Fever.  Increased blood pressure.  Fast pulse.  Restlessness or trouble sleeping. These signs and symptoms occur within several hours of stopping or reducing short-acting opioids, such as heroin. They can occur within 3 days of stopping or reducing long-acting opioids, such as methadone. Withdrawal begins within minutes of receiving a drug that blocks the effects of opioids, such as naltrexone or naloxone. DIAGNOSIS  Opioid use disorder is diagnosed by your health care provider. You will be asked about your symptoms, drug and alcohol use, medical history, and use of medicines. A physical exam may be done. Lab tests may be ordered. Your health care provider may have you see a mental health professional.  TREATMENT  The treatment for opioid withdrawal is usually provided by medical doctors with special training in substance use disorders (addiction specialists). The following medicines may be included in treatment:  Opioids given in place of the abused opioid. They turn on opioid receptors in the brain and lessen or prevent withdrawal symptoms. They are gradually  decreased (opioid substitution and taper).  Non-opioids that can lessen certain opioid withdrawal symptoms. They may be used alone or with opioid substitution and taper. Successful long-term recovery usually requires medicine, counseling, and group support. HOME CARE INSTRUCTIONS   Take medicines only as directed by your health care provider.  Check with your health care provider before starting new medicines.  Keep all follow-up visits as directed by your health care provider. SEEK MEDICAL CARE IF:  You are not able to take your medicines as directed.  Your symptoms get worse.  You relapse. SEEK IMMEDIATE MEDICAL CARE IF:  You have serious thoughts about hurting yourself or others.  You have a seizure.  You lose consciousness. Document Released: 12/27/2002 Document Revised: 05/10/2013 Document Reviewed: 01/06/2013 [facility] Patient Information 2015 [place], [place]. This information is not intended to replace advice given to you by your health care provider. Make sure you discuss any questions you have with your health care provider.  Polysubstance Abuse When people abuse more than one drug or type of drug it is called polysubstance or polydrug abuse. For example, many smokers also drink alcohol. This is one form of polydrug abuse. Polydrug abuse also refers to the use of a drug to counteract an unpleasant effect produced by another drug. It may also be used to help with withdrawal from another drug. People who take stimulants may become agitated. Sometimes this agitation is countered with a tranquilizer. This helps protect against the unpleasant side effects. Polydrug abuse also refers to the use of different drugs at the same time.  Anytime drug use is interfering with normal living activities, it has become abuse. This includes problems  with family and friends. Psychological dependence has developed when your mind tells you that the drug is needed. This is usually followed by physical  dependence which has developed when continuing increases of drug are required to get the same feeling or "high". This is known as addiction or chemical dependency. A person's risk is much higher if there is a history of chemical dependency in the family. SIGNS OF CHEMICAL DEPENDENCY  You have been told by friends or family that drugs have become a problem.  You fight when using drugs.  You are having blackouts (not remembering what you do while using).  You feel sick from using drugs but continue using.  You lie about use or amounts of drugs (chemicals) used.  You need chemicals to get you going.  You are suffering in work performance or in school because of drug use.  You get sick from use of drugs but continue to use anyway.  You need drugs to relate to people or feel comfortable in social situations.  You use drugs to forget problems. "Yes" answered to any of the above signs of chemical dependency indicates there are problems. The longer the use of drugs continues, the greater the problems will become. If there is a family history of drug or alcohol use, it is best not to experiment with these drugs. Continual use leads to tolerance. After tolerance develops more of the drug is needed to get the same feeling. This is followed by addiction. With addiction, drugs become the most important part of life. It becomes more important to take drugs than participate in the other usual activities of life. This includes relating to friends and family. Addiction is followed by dependency. Dependency is a condition where drugs are now needed not just to get high, but to feel normal. Addiction cannot be cured but it can be stopped. This often requires outside help and the care of professionals. Treatment centers are listed in the yellow pages under: Cocaine, Narcotics, and Alcoholics Anonymous. Most hospitals and clinics can refer you to a specialized care center. Talk to your caregiver if you need  help. Document Released: 08/15/2004 Document Revised: 03/18/2011 Document Reviewed: 12/24/2004 [facility]Exit[facility] Patient Information 2015 [place], [place]. This information is not intended to replace advice given to you by your health care provider. Make sure you discuss any questions you have with your health care provider.

## 2013-10-13 NOTE — Consult Note (Signed)
Vicki PettyMegan T Fonder                                                                               10/13/2013                                               Pediatric Ophthalmology Consultation                                         Consult requested by: Dr. Darnelle Catalanama  Reason for consultation:  fungemia  HPI: 28yo female with IV drug use history; HIV+, HCV Ab rx. Blood cultures grew yeast and pt had to be admitted for workup. Ophthalmology called to r/o fungal endophthalmitis.  Pertinent Medical History:   Active Ambulatory Problems    Diagnosis Date Noted  . No Active Ambulatory Problems   Resolved Ambulatory Problems    Diagnosis Date Noted  . No Resolved Ambulatory Problems   Past Medical History  Diagnosis Date  . Anemia   . Asthma   . Sciatica   . Hypertension   . Constipation      Pertinent Ophthalmic History: Glasses; otherwise noncontributory  Current Eye Medications: none  Systemic medications on admission:   Medications Prior to Admission  Medication Sig Dispense Refill  . acetaminophen (TYLENOL) 500 MG tablet Take 500 mg by mouth every 6 (six) hours as needed for headache.       . albuterol (PROVENTIL HFA;VENTOLIN HFA) 108 (90 BASE) MCG/ACT inhaler Inhale 2 puffs into the lungs every 6 (six) hours as needed for wheezing or shortness of breath.      . cloNIDine (CATAPRES) 0.1 MG tablet Take 0.1 mg by mouth 2 (two) times daily.      Marland Kitchen. ibuprofen (ADVIL,MOTRIN) 600 MG tablet Take 600 mg by mouth every 6 (six) hours as needed.      . medroxyPROGESTERone (DEPO-PROVERA) 150 MG/ML injection Inject 150 mg into the muscle every 3 (three) months.      . ondansetron (ZOFRAN-ODT) 4 MG disintegrating tablet Take 4 mg by mouth every 8 (eight) hours as needed for nausea or vomiting.      Marland Kitchen. QUEtiapine (SEROQUEL XR) 300 MG 24 hr tablet Take 300 mg by mouth at bedtime.           ROS: no eye problems, no vision problems, negative otherwise; "I want to GO HOME!!!!!"  Visual Fields: FTC OU       Pupils:  Equal, brisk, no APD   Near acuity:   cc OD   csm J1+      cc OS   CSM  J1+  TA:        Normal to palpation OU      Dilation:  both eyes        Medication used  [ x ] NS 2.5% [ x ]Tropicamide  [  ] Cyclogyl [  ] Cyclomydril  External:   OD:  Normal  OS:  Normal     Anterior segment exam:  By penlight     Conjunctiva:  OD:  Quiet     OS:  Quiet    Cornea:    OD: Clear, no fluorescein stain      OS: Clear, no fluorescein stain     Anterior Chamber:   OD:  Deep/quiet     OS:  Deep/quiet    Iris:    OD:  Normal      OS:  Normal     Lens:    OD:  Clear        OS:  Clear         Optic disc:  OD:  Flat, sharp, pink, healthy     OS:  Flat, sharp, pink, healthy     Central retina--examined with indirect ophthalmoscope:  OD:  Macula and vessels normal; media clear     OS:  Macula and vessels normal; media clear     Peripheral retina--examined with indirect ophthalmoscope:  OD:  Normal to ora 360 degrees     OS:  Normal to ora 360 degrees     Impression:   Normal eye exam.  Recommendations/Plan:  Follow up with regular eye care provider as needed. Advised to discontinue drug use and maintain IM appointments given HIV+ status.  Please contact our office with any questions or concerns at [phone].   Pam Vanalstine

## 2013-10-13 NOTE — OR Nursing (Signed)
Unable to sedate due to a high tolerance to narcotics.  Anesthesia here to intubate and sedate with 200 mg Diprivan, 100 mg succinylcholine, 20 lidocaine Dr. Noreene LarssonJoslin.  Patient tolerated TEE with complications

## 2013-10-13 NOTE — Anesthesia Postprocedure Evaluation (Signed)
  Anesthesia Post-op Note  Patient: Jefm PettyMegan T Searing  Procedure(s) Performed: Procedure(s): TRANSESOPHAGEAL ECHOCARDIOGRAM (TEE) (N/A)  Patient Location: Endoscopy Unit  Anesthesia Type:General  Level of Consciousness: awake, alert  and oriented  Airway and Oxygen Therapy: Patient Spontanous Breathing  Post-op Pain: none  Post-op Assessment: Post-op Vital signs reviewed, Patient's Cardiovascular Status Stable, Respiratory Function Stable, Patent Airway and No signs of Nausea or vomiting  Post-op Vital Signs: stable  Last Vitals:  Filed Vitals:   10/13/13 1620  BP: 148/86  Pulse: 92  Temp: 36.8 C  Resp: 20    Complications: No apparent anesthesia complications

## 2013-10-14 ENCOUNTER — Encounter (HOSPITAL_COMMUNITY): Payer: Self-pay | Admitting: Internal Medicine

## 2013-10-14 ENCOUNTER — Telehealth (HOSPITAL_BASED_OUTPATIENT_CLINIC_OR_DEPARTMENT_OTHER): Payer: Self-pay | Admitting: Emergency Medicine

## 2013-10-14 LAB — CULTURE, BLOOD (ROUTINE X 2)

## 2013-10-14 NOTE — Progress Notes (Signed)
ED Antimicrobial Stewardship Positive Culture Follow Up   Vicki Whitehead is an 28 y.o. female who presented to [facility] on 10/11/2013 with a chief complaint of  Chief Complaint  Patient presents with  . Blood Infection    Recent Results (from the past 720 hour(s))  CULTURE, BLOOD (ROUTINE X 2)     Status: None   Collection Time    10/09/13 12:40 PM      Result Value Ref Range Status   Specimen Description BLOOD RIGHT ARM   Final   Special Requests BOTTLES DRAWN AEROBIC AND ANAEROBIC 4CC EACH   Final   Culture  Setup Time     Final   Value: 10/09/2013 20:33     Performed at [facility]   Culture     Final   Value: CANDIDA ALBICANS     Note: Gram Stain Report Called to,Read Back By and Verified With: MICHELLE H@12 :06PM ON 10/11/13 BY DANTS     Performed at [facility]   Report Status 10/13/2013 FINAL   Final  CULTURE, BLOOD (ROUTINE X 2)     Status: None   Collection Time    10/09/13 12:40 PM      Result Value Ref Range Status   Specimen Description BLOOD RIGHT ARM   Final   Special Requests BOTTLES DRAWN AEROBIC AND ANAEROBIC [facility] EACH   Final   Culture  Setup Time     Final   Value: 10/09/2013 20:33     Performed at [facility]   Culture     Final   Value:        BLOOD CULTURE RECEIVED NO GROWTH TO DATE CULTURE WILL BE HELD FOR 5 DAYS BEFORE ISSUING A FINAL NEGATIVE REPORT     Performed at [facility]   Report Status PENDING   Incomplete  GC/CHLAMYDIA PROBE AMP     Status: None   Collection Time    10/09/13  5:00 PM      Result Value Ref Range Status   CT Probe RNA NEGATIVE  NEGATIVE Final   GC Probe RNA NEGATIVE  NEGATIVE Final   Comment: (NOTE)                                                                                               **Normal Reference Range: Negative**          Assay performed using the Gen-Probe APTIMA COMBO2 (R) Assay.     Acceptable specimen types for this assay include APTIMA Swabs (Unisex,   endocervical, urethral, or vaginal), first void urine, and ThinPrep     liquid based cytology samples.     Performed at [facility]  CULTURE, BLOOD (ROUTINE X 2)     Status: None   Collection Time    10/11/13  4:22 PM      Result Value Ref Range Status   Specimen Description BLOOD LEFT HAND   Final   Special Requests BOTTLES DRAWN AEROBIC AND ANAEROBIC 2CC   Final   Culture  Setup Time     Final   Value:  10/11/2013 22:54     [facility]   Culture     Final   Value: GRAM NEGATIVE RODS     [facility]   Report Status PENDING   Incomplete  CULTURE, BLOOD (ROUTINE X 2)     Status: None   Collection Time    10/11/13  4:22 PM      Result Value Ref Range Status   Specimen Description BLOOD LEFT HAND   Final   Special Requests BOTTLES DRAWN AEROBIC AND ANAEROBIC 2CC   Final   Culture  Setup Time     Final   Value: 10/11/2013 22:56     [facility]   Culture     Final   Value: CANDIDA ALBICANS     Note: Gram Stain Report Called to,Read Back By and Verified With: MICHELLE H@12 :06PM ON 10/11/13 BY DANTS     [facility]   Report Status 10/14/2013 FINAL   Final  URINE CULTURE     Status: None   Collection Time    10/11/13  7:14 PM      Result Value Ref Range Status   Specimen Description URINE, CLEAN CATCH   Final   Special Requests NONE   Final   Culture  Setup Time     Final   Value: 10/12/2013 03:29     [facility]   Colony Count     Final   Value: NO GROWTH     [facility]   Culture     Final   Value: NO GROWTH     [facility]   Report Status 10/12/2013 FINAL   Final  CULTURE, BLOOD (ROUTINE X 2)     Status: None   Collection Time    10/13/13  4:48 PM      Result Value Ref Range Status   Specimen Description BLOOD LEFT ARM   Final   Special Requests BOTTLES DRAWN AEROBIC ONLY  2CC   Final   Culture  Setup Time      Final   Value: 10/13/2013 20:13     [facility]   Culture     Final   Value:        BLOOD CULTURE RECEIVED NO GROWTH TO DATE CULTURE WILL BE HELD FOR 5 DAYS BEFORE ISSUING A FINAL NEGATIVE REPORT     [facility]   Report Status PENDING   Incomplete  CULTURE, BLOOD (ROUTINE X 2)     Status: None   Collection Time    10/13/13  4:55 PM      Result Value Ref Range Status   Specimen Description BLOOD RIGHT HAND   Final   Special Requests BOTTLES DRAWN AEROBIC AND ANAEROBIC  4CC   Final   Culture  Setup Time     Final   Value: 10/13/2013 20:13     [facility]   Culture     Final   Value:        BLOOD CULTURE RECEIVED NO GROWTH TO DATE CULTURE WILL BE HELD FOR 5 DAYS BEFORE ISSUING A FINAL NEGATIVE REPORT     [facility]   Report Status PENDING   Incomplete    28 yo who came in with fevers and she has a hx IVDA. One of her blood cultures on 10/5 grew  out candida albicans and the other has GNR. Dr. Daiva Eves saw her [facility] at [facility]. She was dc on fluconazole. Dw GNR with Dr Daiva Eves, and this could possibly be a contaminant so will only watch for now.    ED Provider: Fayrene Helper, PA  Ulyses Southward, PharmD Pager: [phone] Infectious Diseases Pharmacist Phone# [phone]

## 2013-10-14 NOTE — Telephone Encounter (Signed)
Post ED Visit - Positive Culture Follow-up  Culture report reviewed by antimicrobial stewardship pharmacist: []  Wes Dulaney, Pharm.D., BCPS []  Celedonio MiyamotoJeremy Frens, Pharm.D., BCPS []  Georgina PillionElizabeth Martin, Pharm.D., BCPS [x]  YutanMinh Pham, VermontPharm.D., BCPS, AAHIVP []  Estella HuskMichelle Turner, Pharm.D., BCPS, AAHIVP []  Carly Sabat, Pharm.D. []  Enzo BiNathan Batchelder, [address] BoulevardPharm[address]  Positive blood culture Treated with none, discharged on fluconazole, Dr. Daiva [place]vesVan [place], no further patient follow-up is required at this time.  Berle MullMiller, Johathan Province 10/14/2013, 10:59 AM

## 2013-10-15 LAB — CULTURE, BLOOD (ROUTINE X 2): CULTURE: NO GROWTH

## 2013-10-16 LAB — CULTURE, BLOOD (ROUTINE X 2)

## 2013-10-19 LAB — CULTURE, BLOOD (ROUTINE X 2)
CULTURE: NO GROWTH
Culture: NO GROWTH

## 2013-10-20 LAB — HLA B*5701: HLA B 5701: NEGATIVE

## 2013-11-10 ENCOUNTER — Ambulatory Visit (INDEPENDENT_AMBULATORY_CARE_PROVIDER_SITE_OTHER): Payer: Medicaid Other | Admitting: Internal Medicine

## 2013-11-10 DIAGNOSIS — B49 Unspecified mycosis: Secondary | ICD-10-CM

## 2013-11-10 NOTE — Progress Notes (Signed)
Patient ID: Vicki Whitehead, female   DOB: 11/25/1985, 28 y.o.   MRN: [id number]          Patient Active Problem List   Diagnosis Date Noted  . 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 Hepatitis C antibody test positive 10/13/2013  . IVDA (intravenous drug abuse) complicating pregnancy 10/12/2013  . Bipolar disorder 10/12/2013  . Depression 10/12/2013  . Fungemia 10/11/2013  . Asthma 10/11/2013  . Hypertension 10/11/2013    Patient's Medications  New Prescriptions   No medications on file  Previous Medications   ACETAMINOPHEN (TYLENOL) 500 MG TABLET    Take 500 mg by mouth every 6 (six) hours as needed for headache.    ALBUTEROL (PROVENTIL HFA;VENTOLIN HFA) 108 (90 BASE) MCG/ACT INHALER    Inhale 2 puffs into the lungs every 6 (six) hours as needed for wheezing or shortness of breath.   CLONIDINE (CATAPRES) 0.1 MG TABLET    Take 0.1 mg by mouth 2 (two) times daily.   IBUPROFEN (ADVIL,MOTRIN) 600 MG TABLET    Take 600 mg by mouth every 6 (six) hours as needed.   MEDROXYPROGESTERONE (DEPO-PROVERA) 150 MG/ML INJECTION    Inject 150 mg into the muscle every 3 (three) months.   ONDANSETRON (ZOFRAN-ODT) 4 MG DISINTEGRATING TABLET    Take 4 mg by mouth every 8 (eight) hours as needed for nausea or vomiting.   QUETIAPINE (SEROQUEL XR) 300 MG 24 HR TABLET    Take 1 tablet (300 mg total) by mouth at bedtime.  Modified Medications   No medications on file  Discontinued Medications   FLUCONAZOLE (DIFLUCAN) 200 MG TABLET    Take 2 tablets (400 mg total) by mouth daily.    Subjective: Vicki Whitehead he is in for her hospital follow-up visit. She was seen in the emergency department on October 3 complaining of fever. One of 2 blood cultures drawn that day grew Candida albicans. She was admitted to the hospital on October 5. Blood cultures drawn that day showed Candida albicans in one set and Sphingomonas paucimobilis in the other set. A third set of blood cultures on October 7 were negative. There was no  clinical or TEE evidence of endocarditis. A formal ophthalmologic exam was normal. She was discharged on October 7 on oral fluconazole 400 mg daily. She stopped when she ran out about one week ago. The plan on discharge was to treat her for 2 weeks after her first negative blood culture. It appears that the Sphingomonas paucimobilis was felt to be a probable insignificant contaminant so it was not treated.  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 She has had 2 inpatient treatments stays with only brief sobriety after discharge. She has never been any consistent outpatient aftercare. She has continued to inject after her recent hospitalization on a daily basis. She states that she no longer uses enough to get high but only to maintain her addiction. She does occasionally share her drug paraphernalia with her boyfriend. She does rinse her syringes and needles with tap water.  During a recent hospitalization she had a false positive HIV Elisa. Her fourth generation antibody test and HIV PCR were both negative. She also had a positive hepatitis C antibody but her hepatitis C PCR was negative.  She is feeling better. She's not having any more fevers but she is still having mild chills and sweats. She thinks these may have gotten slightly worse since stopping fluconazole. Review of Systems: Pertinent items  are noted in HPI.  Past Medical History  Diagnosis Date  . Anemia   . Asthma   . Sciatica   . Hypertension   . Constipation     History  Substance Use Topics  . Smoking status: Current Every Day Smoker -- 0.50 packs/day for 15 years    Types: Cigarettes  . Smokeless tobacco: Never Used  . Alcohol Use: Yes     Comment: "rarely"    No family history on file.  No Known Allergies  Objective: Temp: 98.6 F (37 C) (11/04 1417) Temp Source: Oral (11/04 1417) BP: 137/90 mmHg (11/04 1417) Pulse Rate: 123 (11/04 1417) Body mass index is 27.49  kg/(m^2).  General: she is alert and in no distress Oral: no oropharyngeal lesions Skin: old and fresh track marks on her hands and arms Lungs: clear Cor: regular S1 and S2 with no murmur  Lab Results Lab Results  Component Value Date   WBC 10.2 10/12/2013   HGB 10.1* 10/12/2013   HCT 29.7* 10/12/2013   MCV 81.8 10/12/2013   PLT 304 10/12/2013    Lab Results  Component Value Date   CREATININE 0.71 10/12/2013   BUN 13 10/12/2013   NA 143 10/12/2013   K 3.5* 10/12/2013   CL 106 10/12/2013   CO2 23 10/12/2013    Lab Results  Component Value Date   ALT 21 10/12/2013   AST 16 10/12/2013   ALKPHOS 106 10/12/2013   BILITOT 0.3 10/12/2013    No results found for: CHOL, HDL, LDLCALC, LDLDIRECT, TRIG, CHOLHDL  Lab Results HIV 1 RNA QUANT (copies/mL)  Date Value  10/09/2013 <20   CD4 T CELL ABS (/uL)  Date Value  10/09/2013 810     Assessment: I will repeat blood cultures today to see if her blood stream infections have resolved. I will continue her off of fluconazole and antibiotics for now. I will see her back within one week.  She is interested in learning more about options for drug treatment.  Plan: 1. Observe off of fluconazole and antibiotic therapy for now 2. Repeat blood cultures 3. I will try to arrange for her to talk to one of our counselors today about drug treatment options 4. Follow-up early next week   Cliffton AstersJohn Arnita Koons, MD [facility] [phone] pager   [phone] cell 11/10/2013, 2:36 PM

## 2013-11-16 ENCOUNTER — Ambulatory Visit: Payer: Medicaid Other | Admitting: Internal Medicine

## 2013-11-16 LAB — CULTURE, BLOOD (SINGLE)
Organism ID, Bacteria: NO GROWTH
Organism ID, Bacteria: NO GROWTH

## 2014-03-07 ENCOUNTER — Other Ambulatory Visit: Payer: Self-pay | Admitting: Internal Medicine

## 2014-03-12 ENCOUNTER — Encounter (HOSPITAL_COMMUNITY): Payer: Self-pay | Admitting: Emergency Medicine

## 2014-03-12 ENCOUNTER — Emergency Department (INDEPENDENT_AMBULATORY_CARE_PROVIDER_SITE_OTHER)
Admission: EM | Admit: 2014-03-12 | Discharge: 2014-03-12 | Disposition: A | Discharge status: Home / Self Care | Payer: Medicaid Other | Source: Home / Self Care | Attending: Family Medicine | Admitting: Family Medicine

## 2014-03-12 DIAGNOSIS — F319 Bipolar disorder, unspecified: Secondary | ICD-10-CM

## 2014-03-12 MED ORDER — QUETIAPINE FUMARATE ER 300 MG PO TB24
300.0000 mg | ORAL_TABLET | Freq: Every day | ORAL | Status: DC
Start: 2014-03-12 — End: 2023-06-05

## 2014-03-12 NOTE — ED Notes (Signed)
Patient needs refill on Seroquel 300 mg. She reports she has been out two days and before then she only took a half a pill each day to preserve them. Patient reports she has been sweaty and moody. Patient is in NAD.

## 2014-03-12 NOTE — Discharge Instructions (Signed)

## 2014-03-12 NOTE — ED Provider Notes (Signed)
CSN: [id number]     Arrival date & time 03/12/14  1154 History   First MD Initiated Contact with Patient 03/12/14 1314     No chief complaint on file.  (Consider location/radiation/quality/duration/timing/severity/associated sxs/prior Treatment) HPI      29 year old female with history of bipolar disorder presents requesting a refill of her Seroquel. She ran out and she denied primary care doctor. She ran out 2 days ago, she has increased agitation since then. No SI or HI. No racing thoughts  Past Medical History  Diagnosis Date  . Anemia   . Asthma   . Sciatica   . Hypertension   . Constipation    Past Surgical History  Procedure Laterality Date  . Tonsillectomy    . Cesarean section    . Tee without cardioversion N/A 10/13/2013    Procedure: TRANSESOPHAGEAL ECHOCARDIOGRAM (TEE);  Surgeon: Chrystie Nose, MD;  Location: [facility] ENDOSCOPY;  Service: Cardiovascular;  Laterality: N/A;   No family history on file. History  Substance Use Topics  . Smoking status: Current Every Day Smoker -- 0.50 packs/day for 15 years    Types: Cigarettes  . Smokeless tobacco: Never Used  . Alcohol Use: Yes     Comment: "rarely"   OB History    No data available     Review of Systems  Psychiatric/Behavioral: Positive for agitation.  All other systems reviewed and are negative.   Allergies  Review of patient's allergies indicates no known allergies.  Home Medications   Prior to Admission medications   Medication Sig Start Date End Date Taking? Authorizing Provider  acetaminophen (TYLENOL) 500 MG tablet Take 500 mg by mouth every 6 (six) hours as needed for headache.     Historical Provider, MD  albuterol (PROVENTIL HFA;VENTOLIN HFA) 108 (90 BASE) MCG/ACT inhaler Inhale 2 puffs into the lungs every 6 (six) hours as needed for wheezing or shortness of breath.    Historical Provider, MD  cloNIDine (CATAPRES) 0.1 MG tablet Take 0.1 mg by mouth 2 (two) times daily.    Historical Provider, MD   ibuprofen (ADVIL,MOTRIN) 600 MG tablet Take 600 mg by mouth every 6 (six) hours as needed.    Historical Provider, MD  medroxyPROGESTERone (DEPO-PROVERA) 150 MG/ML injection Inject 150 mg into the muscle every 3 (three) months.    Historical Provider, MD  ondansetron (ZOFRAN-ODT) 4 MG disintegrating tablet Take 4 mg by mouth every 8 (eight) hours as needed for nausea or vomiting.    Historical Provider, MD  QUEtiapine (SEROQUEL XR) 300 MG 24 hr tablet Take 1 tablet (300 mg total) by mouth at bedtime. 03/12/14   Adrian Blackwater [name], PA-C   BP 118/67 mmHg  Pulse 74  Temp(Src) 97.9 F (36.6 C) (Oral)  Resp 16  SpO2 98% Physical Exam  Constitutional: She is oriented to person, place, and time. Vital signs are normal. She appears well-developed and well-nourished. No distress.  HENT:  Head: Normocephalic and atraumatic.  Pulmonary/Chest: Effort normal. No respiratory distress.  Neurological: She is alert and oriented to person, place, and time. She has normal strength. Coordination normal.  Skin: Skin is warm and dry. No rash noted. She is not diaphoretic.  Psychiatric: She has a normal mood and affect. Judgment normal.  Nursing note and vitals reviewed.   ED Course  Procedures (including critical care time) Labs Review Labs Reviewed - No data to display  Imaging Review No results found.   MDM   1. Bipolar 1 disorder    Seroquel refill.  Encouraged to establish primary care, also given her information to follow-up with [facility].    Graylon GoodZachary H Eleana Tocco, PA-C 03/12/14 [phone]

## 2014-10-28 DIAGNOSIS — F1110 Opioid abuse, uncomplicated: Secondary | ICD-10-CM | POA: Insufficient documentation

## 2014-11-02 DIAGNOSIS — F1193 Opioid use, unspecified with withdrawal: Secondary | ICD-10-CM | POA: Insufficient documentation

## 2016-01-04 IMAGING — CT CT ABD-PELV W/ CM
1 of 2 series · 15 of 32 positions shown, 19 images · IV contrast (OMNIPAQUE 300)
Comparison: None.

CLINICAL DATA: Sick for 5 days, with elevated liver enzymes,
nausea, and diarrhea, history of C-section

EXAM:
CT ABDOMEN AND PELVIS WITH CONTRAST
TECHNIQUE: Multidetector CT imaging of the abdomen and pelvis was performed
using the standard protocol following bolus administration of
intravenous contrast.
CONTRAST:  100mL OMNIPAQUE IOHEXOL 300 MG/ML  SOLN

[Series 2: abd/pel with · axial · 0.72mm/px · z∈[-526,-116]mm · 15 of 90 slices shown, 19 images]
[im 4/90  soft-tissue]
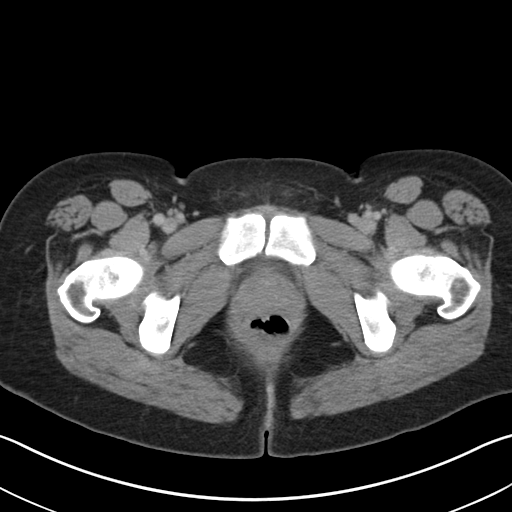
[im 4/90  bone]
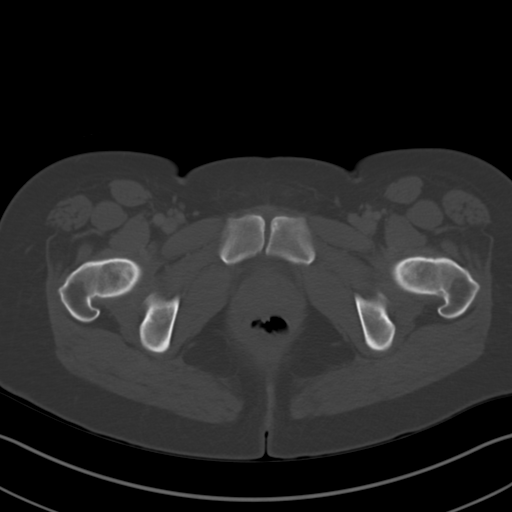
[im 11/90  soft-tissue]
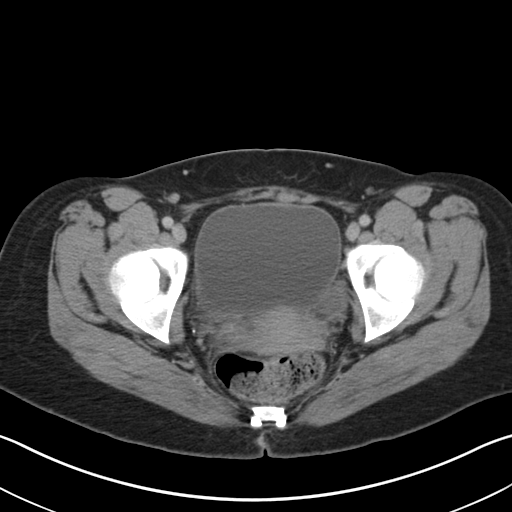
[im 18/90  soft-tissue]
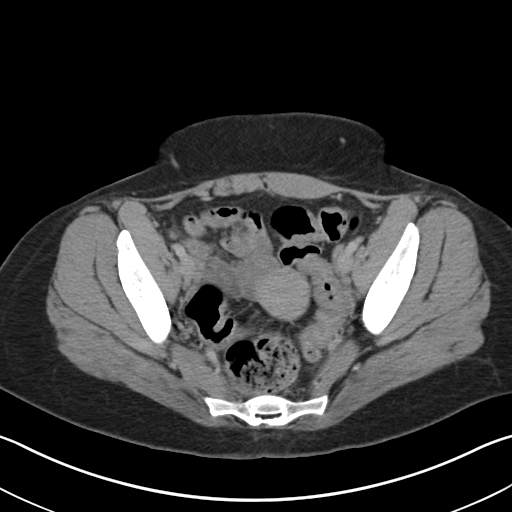
[im 25/90  soft-tissue]
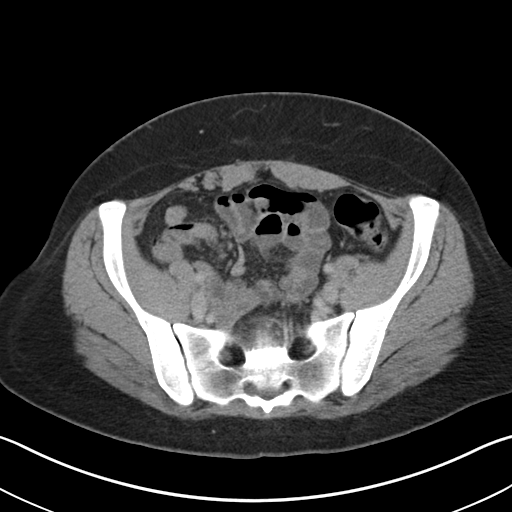
[im 33/90  soft-tissue]
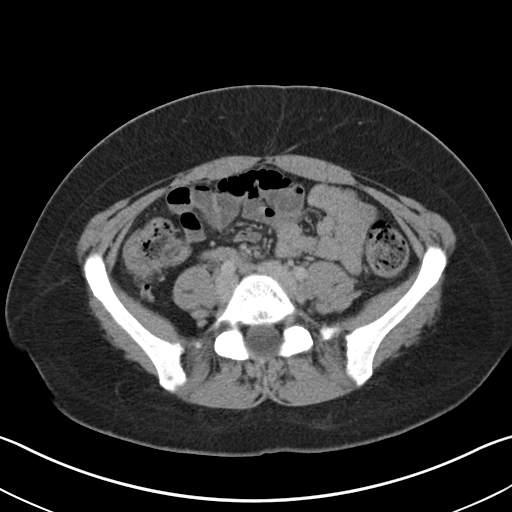
[im 40/90  soft-tissue]
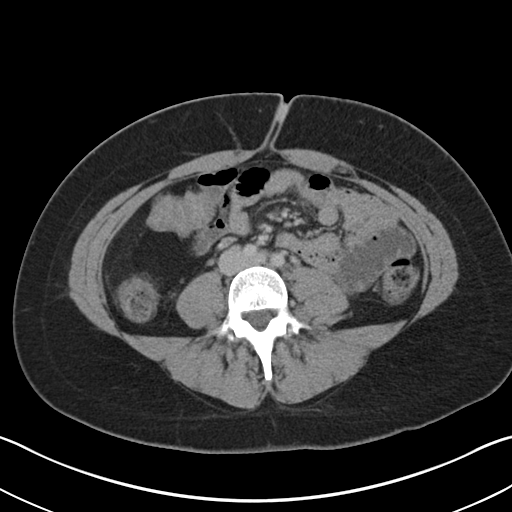
[im 47/90  soft-tissue]
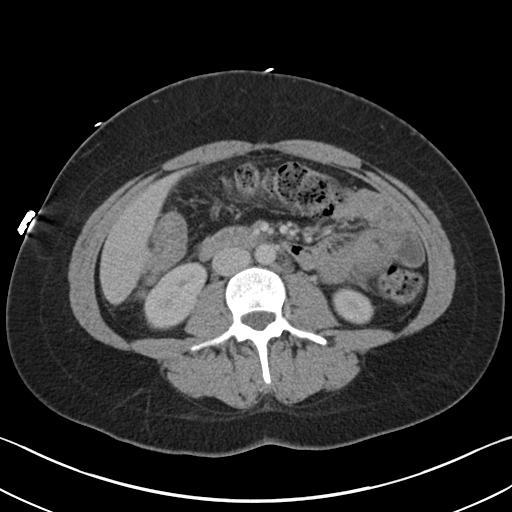
[im 50/90  soft-tissue]
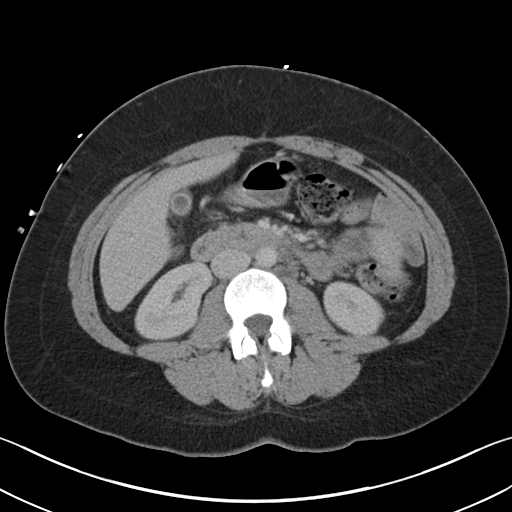
[im 57/90  soft-tissue]
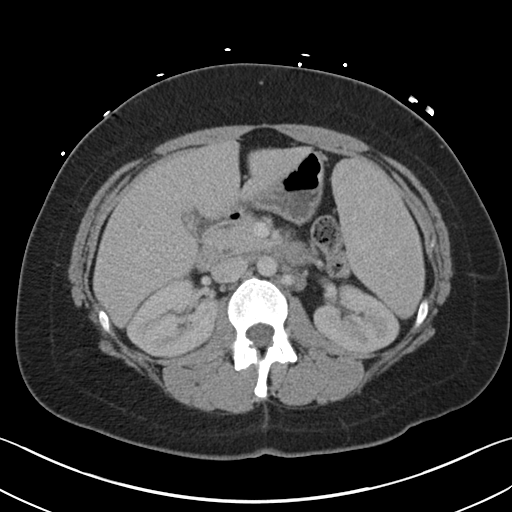
[im 57/90  bone]
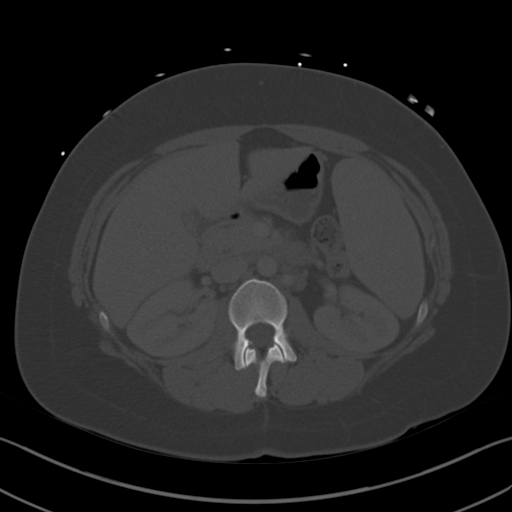
[im 65/90  soft-tissue]
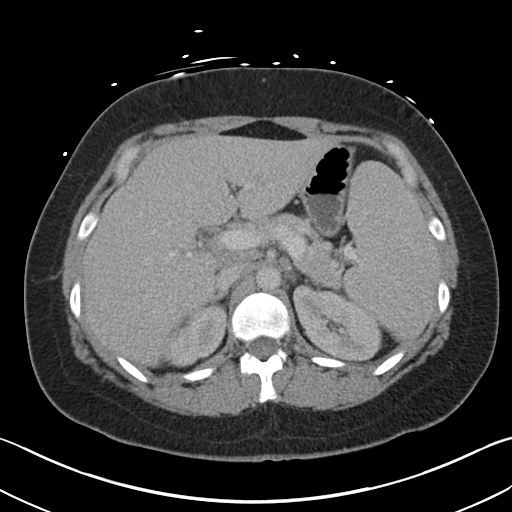
[im 72/90  soft-tissue]
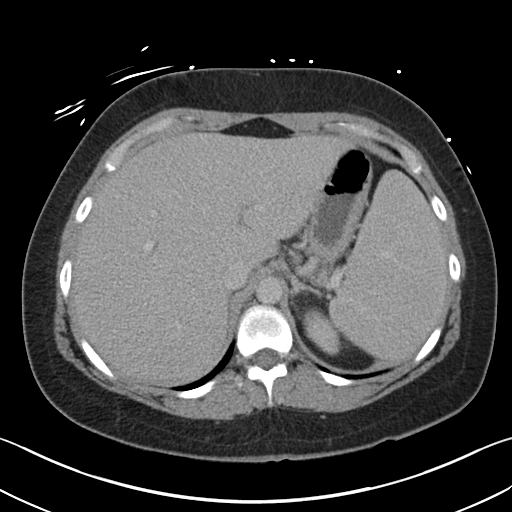
[im 75/90  lung]
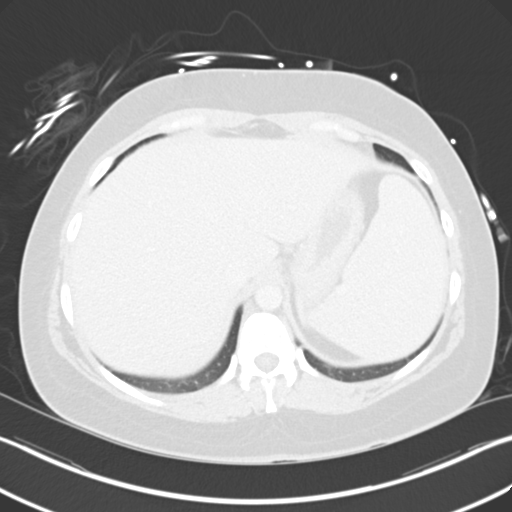
[im 79/90  soft-tissue]
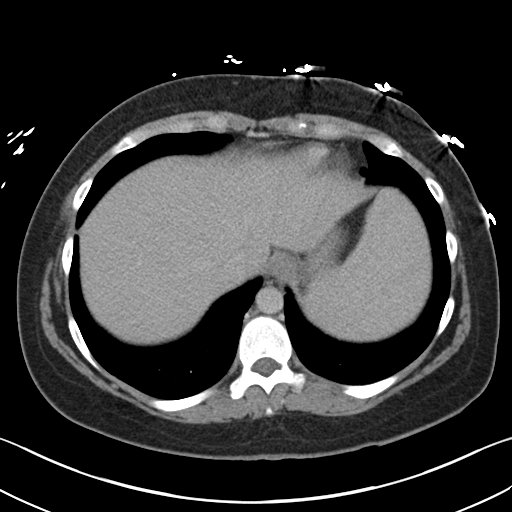
[im 79/90  lung]
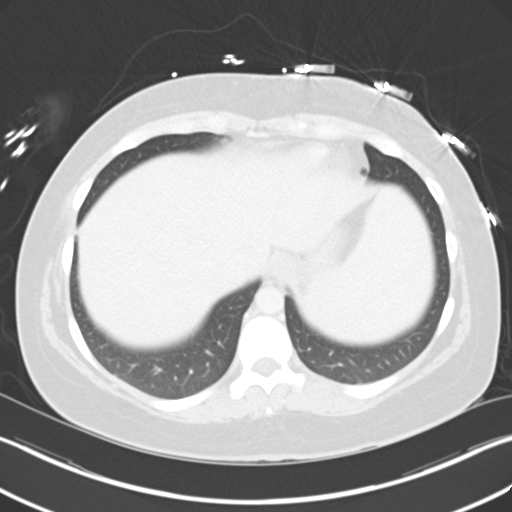
[im 82/90  lung]
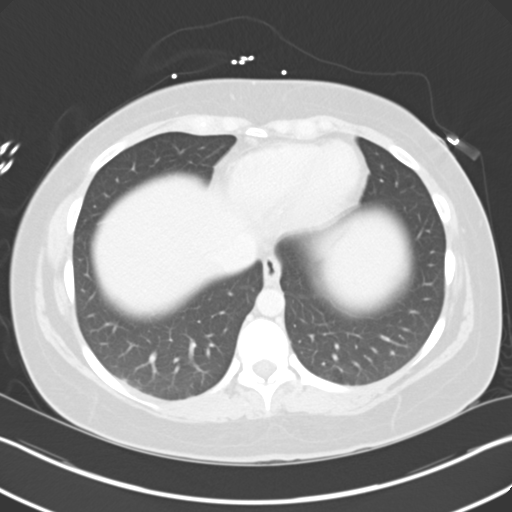
[im 86/90  soft-tissue]
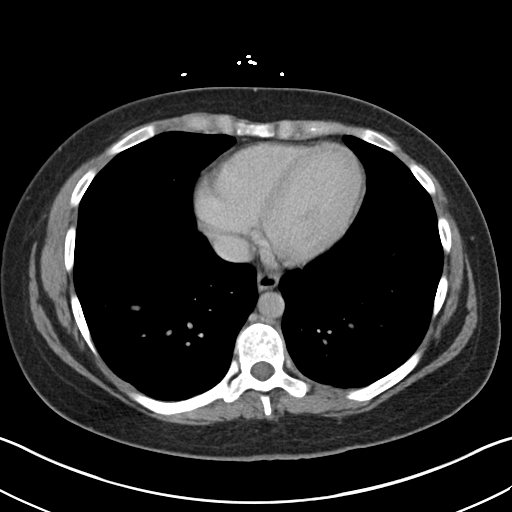
[im 86/90  lung]
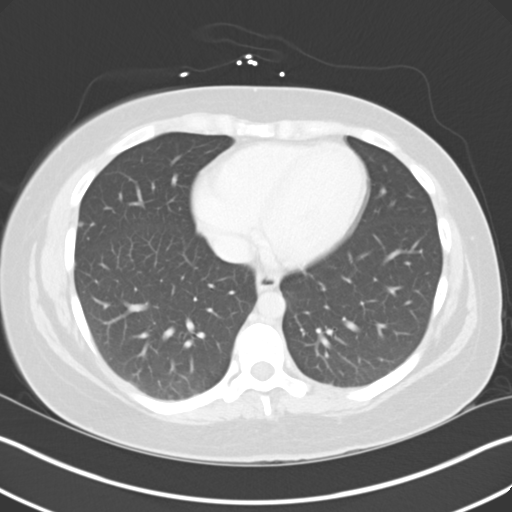

[15 of 32 positions shown; findings below may reference images not displayed]

FINDINGS: Liver is enlarged at 21.4 cm. Spleen is enlarged at 16.7 cm. No
focal hepatic or splenic lesions identified. There is fluid in the
gallbladder fossa. There may be sludge within the gallbladder lumen.

The pancreas is normal. Adrenal glands are normal. Kidneys are
normal.

There are numerous small retroperitoneal periaortic and aortocaval
lymph nodes, with the largest measuring 9 mm in short axis, below
the level of the left kidney.

Stomach is normal. Small bowel is normal. Appendix is normal. Large
bowel is normal.

Bladder is normal. Uterus is normal. 27 mm cystic lesion left ovary
noted. Tiny nodule within the anterior aspect of the cyst may
represent volume averaging, appearing as a 3 mm nodule. Right adnexa
normal.

No free fluid in the abdomen or pelvis. No acute musculoskeletal
findings. Visualized portions of the lung bases clear.
IMPRESSION: Hepatosplenomegaly with no focal hepatic or splenic abnormalities.

Fluid in the gallbladder fossa which is a nonspecific finding given
report of and findings to suggest hepatic parenchymal disease.
However cholecystitis not excluded. There may be sludge in the
gallbladder. If indicated consider right upper quadrant abdomen
ultrasound.

2.7 mm probably benign left ovarian cystic lesion. Given the
appearance of a tiny nodule however, would recommend further
characterization with pelvic ultrasound. This recommendation follows
ACR consensus guidelines: White Paper of the ACR Incidental Findings
Committee II on Adnexal Findings. [HOSPITAL] [DATE].

## 2016-01-06 IMAGING — CR DG CHEST 2V
2 series · 2 of 2 positions shown · non-contrast
Comparison: 10/09/2013.

CLINICAL DATA: Fevers for the past 7 days. IV drug use. The patient
describes recent blood cultures that grew yeast. Clinical concern
for endocarditis.

EXAM:
CHEST  2 VIEW

[w chest pa]
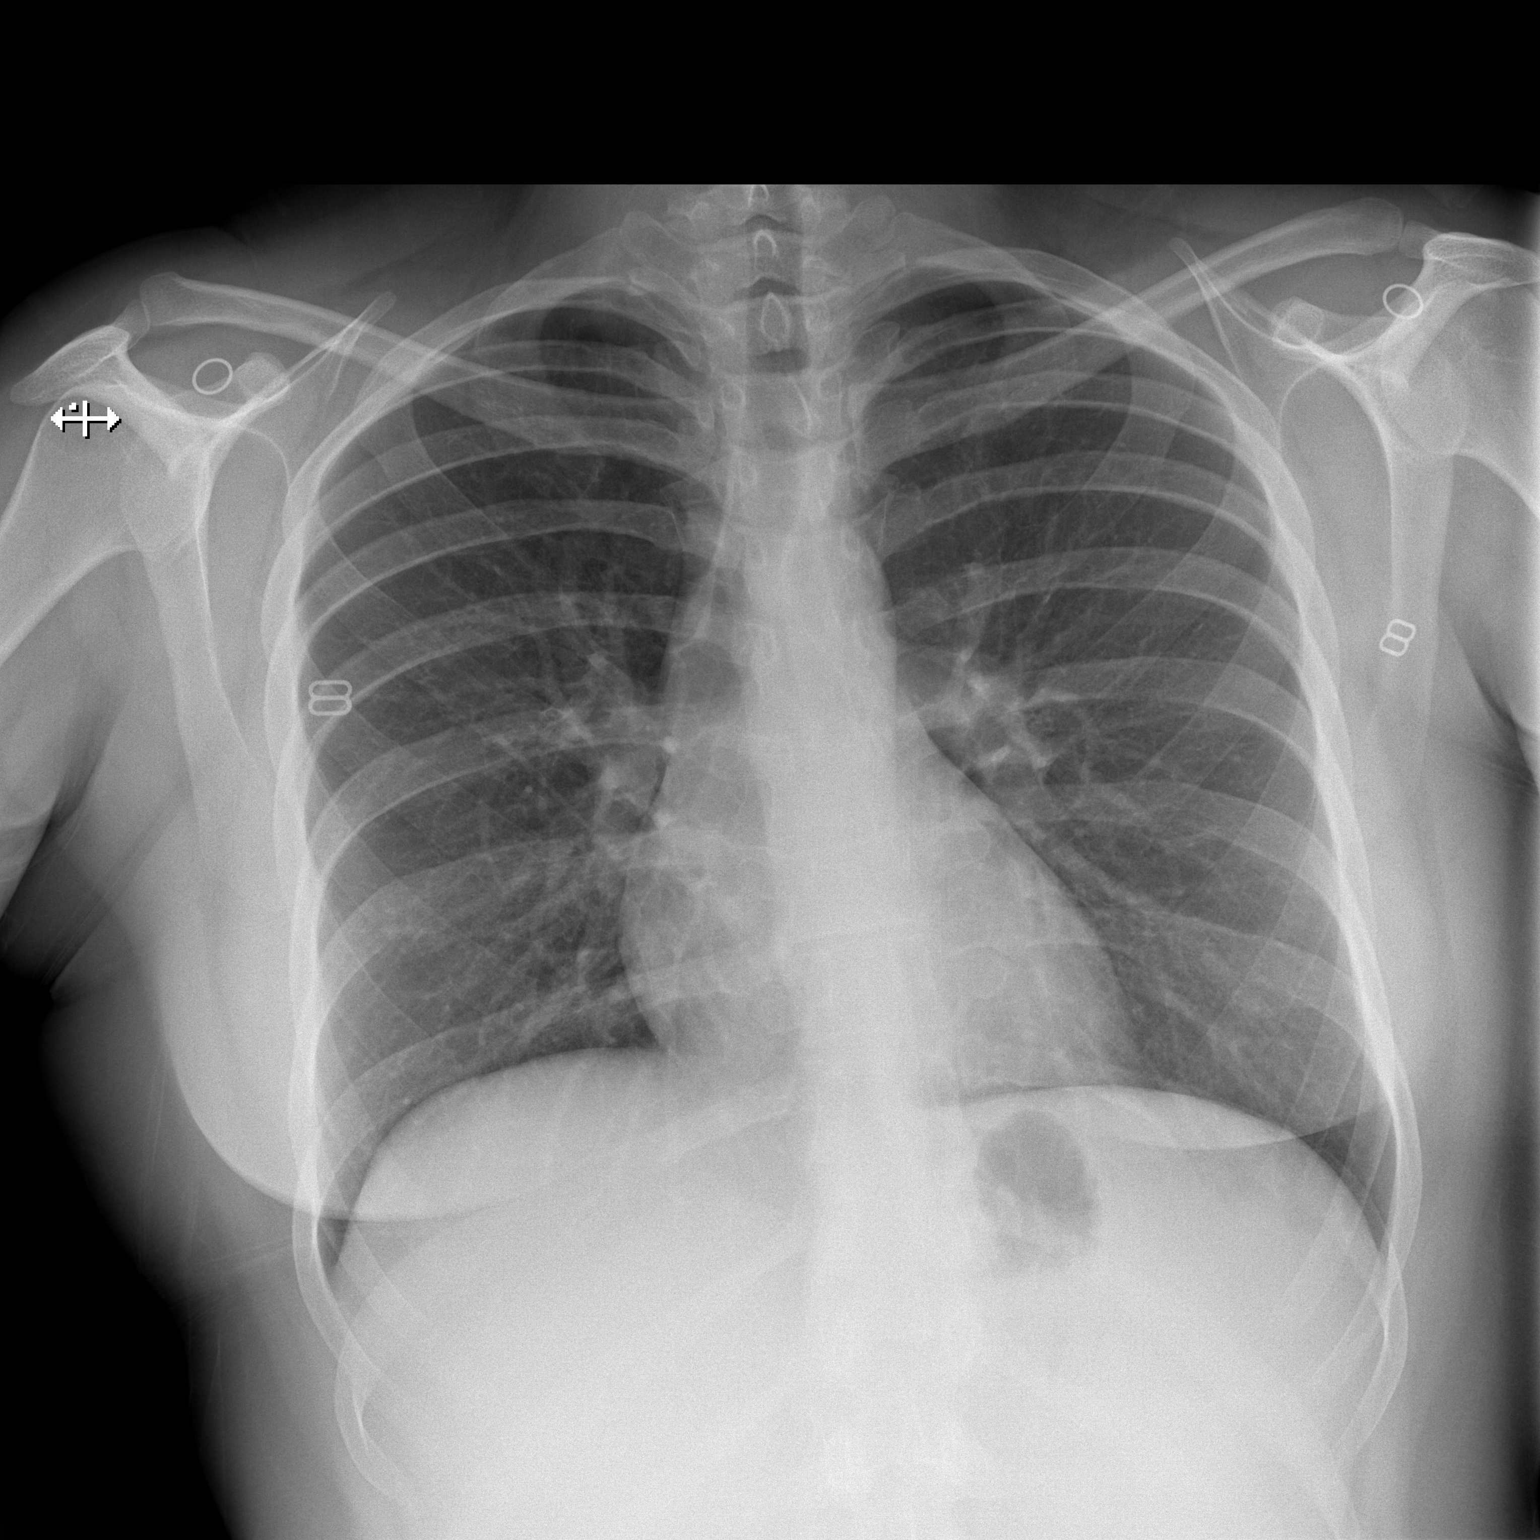

[w chest lat]
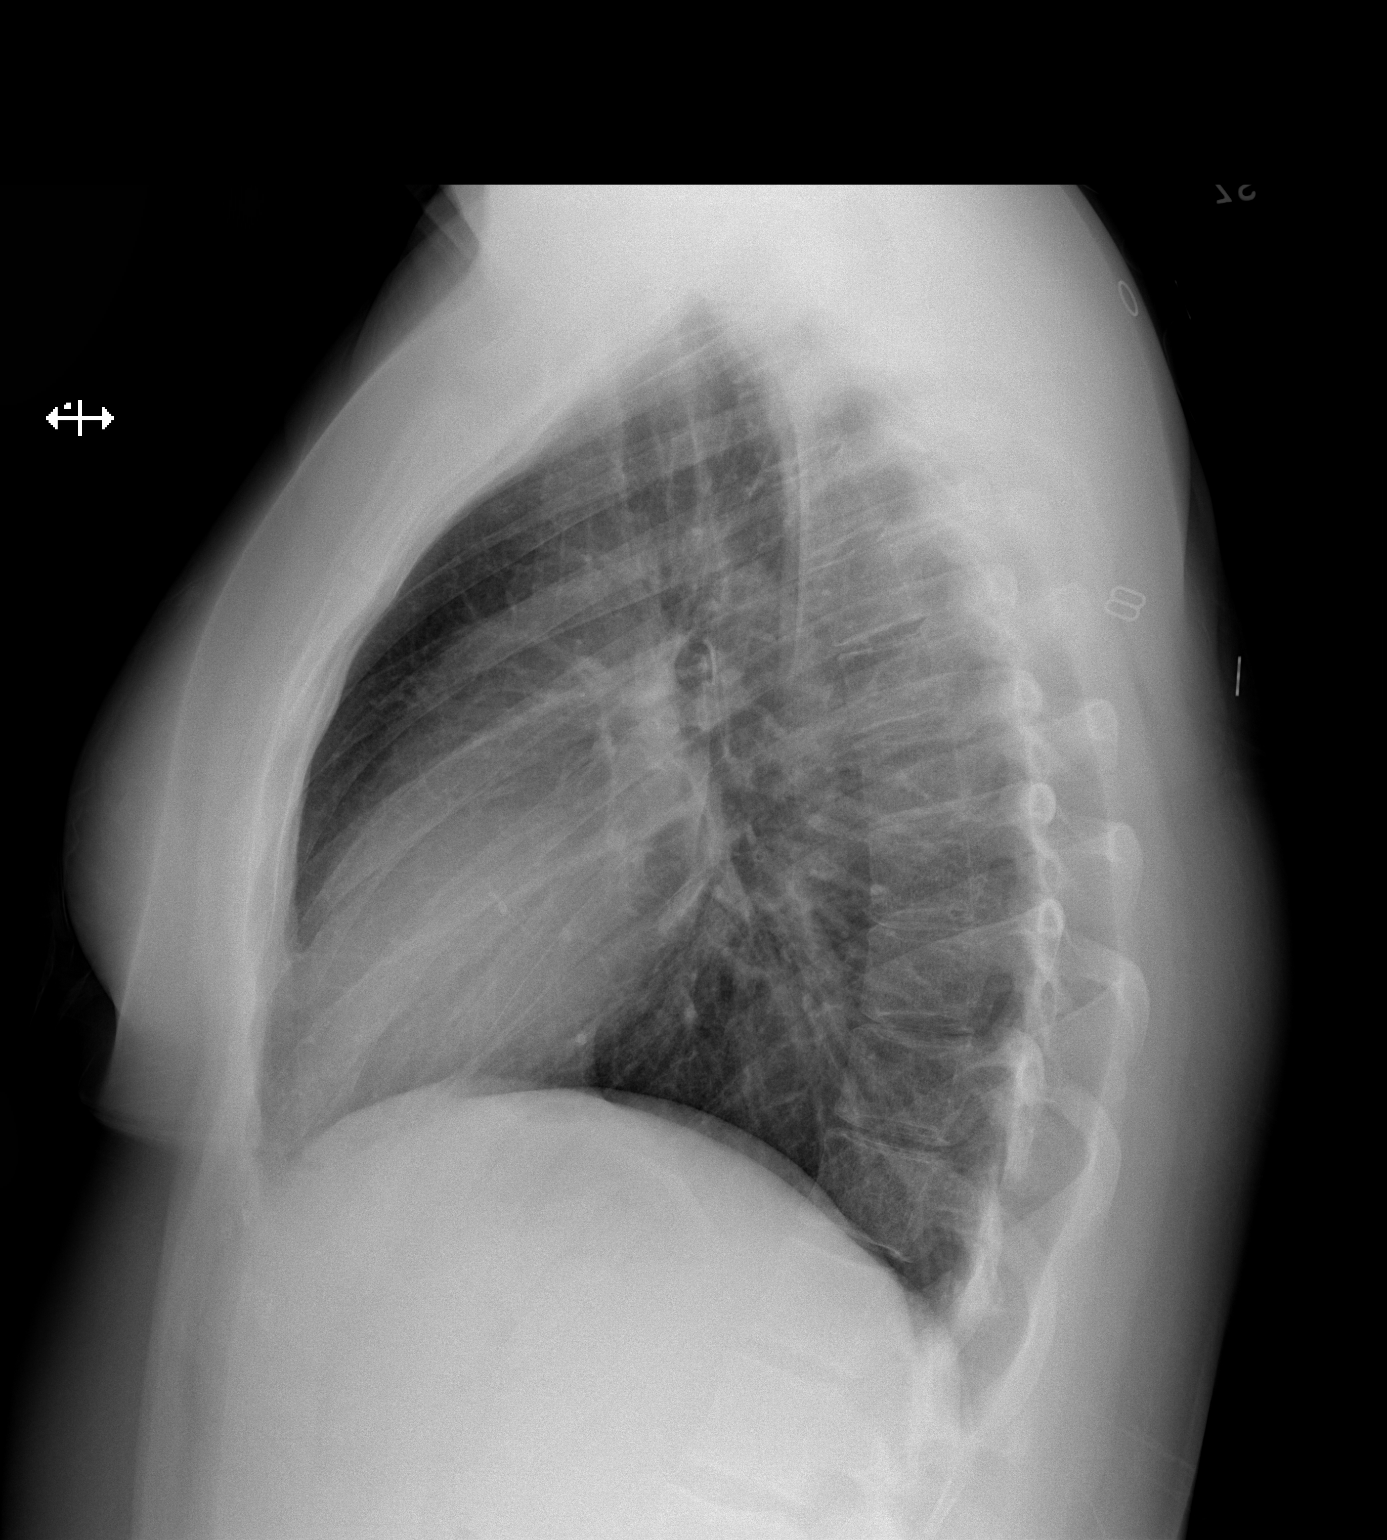

[2 of 2 positions shown; findings below may reference images not displayed]

FINDINGS: Normal sized heart.  Clear lungs.  Mild scoliosis.
IMPRESSION: No acute abnormality.

## 2018-12-02 DIAGNOSIS — A419 Sepsis, unspecified organism: Secondary | ICD-10-CM | POA: Insufficient documentation

## 2018-12-03 DIAGNOSIS — F1520 Other stimulant dependence, uncomplicated: Secondary | ICD-10-CM | POA: Insufficient documentation

## 2018-12-03 DIAGNOSIS — F1120 Opioid dependence, uncomplicated: Secondary | ICD-10-CM | POA: Insufficient documentation

## 2018-12-04 DIAGNOSIS — I82621 Acute embolism and thrombosis of deep veins of right upper extremity: Secondary | ICD-10-CM | POA: Insufficient documentation

## 2020-01-31 LAB — COMPREHENSIVE METABOLIC PANEL
ALT: 22 U/L (ref 0–33)
AST: 26 U/L (ref 0–32)
Albumin/Globulin Ratio: 0.95 mmol/L — ABNORMAL LOW (ref 1.00–2.70)
Albumin: 3.6 g/dL (ref 3.5–5.2)
Alk Phosphatase: 124 U/L — ABNORMAL HIGH (ref 35–117)
Anion Gap: 8 mmol/L (ref 2–17)
BUN: 15 mg/dL (ref 6–20)
CO2: 26 mmol/L (ref 22–29)
Calcium: 8.8 mg/dL (ref 8.6–10.0)
Chloride: 100 mmol/L (ref 98–107)
Creatinine: 0.6 mg/dL (ref 0.5–1.0)
GFR African American: 139 mL/min/{1.73_m2} (ref >=90)
GFR Non-African American: 120 mL/min/{1.73_m2} (ref >=90)
Globulin: 3.7 g/dL (ref 1.9–4.4)
Glucose: 120 mg/dL — ABNORMAL HIGH (ref 70–99)
OSMOLALITY CALCULATED: 270 mOsm/kg (ref 270–287)
Potassium: 4.3 mmol/L (ref 3.5–5.3)
Sodium: 134 mmol/L — ABNORMAL LOW (ref 135–145)
Total Bilirubin: 0.19 mg/dL (ref 0.00–1.20)
Total Protein: 7.3 g/dL (ref 6.4–8.3)

## 2020-01-31 LAB — CBC WITH AUTO DIFFERENTIAL
Absolute Baso #: 0 10*3/uL (ref 0.0–0.2)
Absolute Eos #: 0.3 10*3/uL (ref 0.0–0.5)
Absolute Lymph #: 1.2 10*3/uL (ref 1.0–3.2)
Absolute Mono #: 0.5 10*3/uL (ref 0.3–1.0)
Basophils %: 0.3 % (ref 0.0–2.0)
Eosinophils %: 3.7 % (ref 0.0–7.0)
Hematocrit: 29.3 % — ABNORMAL LOW (ref 34.0–47.0)
Hemoglobin: 9.4 g/dL — ABNORMAL LOW (ref 11.5–15.7)
Immature Grans (Abs): 0.01 10*3/uL (ref 0.00–0.06)
Immature Granulocytes: 0.1 % (ref 0.1–0.6)
Lymphocytes: 16.1 % (ref 15.0–45.0)
MCH: 25.8 pg — ABNORMAL LOW (ref 27.0–34.5)
MCHC: 32.1 g/dL (ref 32.0–36.0)
MCV: 80.5 fL — ABNORMAL LOW (ref 81.0–99.0)
MPV: 9.6 fL (ref 7.2–13.2)
Monocytes: 6.2 % (ref 4.0–12.0)
NRBC Absolute: 0 10*3/uL (ref 0.000–0.012)
NRBC Automated: 0 % (ref 0.0–0.2)
Neutrophils %: 73.6 % (ref 42.0–74.0)
Neutrophils Absolute: 5.4 10*3/uL (ref 1.6–7.3)
Platelets: 369 10*3/uL (ref 140–440)
RBC: 3.64 x10e6/mcL (ref 3.60–5.20)
RDW: 16.1 % — ABNORMAL HIGH (ref 11.0–16.0)
WBC: 7.4 10*3/uL (ref 3.8–10.6)

## 2020-01-31 LAB — SALICYLATE LEVEL: Salicylate Lvl: <0.3 mg/dL — ABNORMAL LOW (ref 3.0–20.0)

## 2020-01-31 LAB — ETHANOL: Ethanol Lvl: NOT DETECTED mg/dL (ref 0.0–10.0)

## 2020-01-31 LAB — ACETAMINOPHEN LEVEL: Acetaminophen Level: <5 ug/mL — ABNORMAL LOW (ref 10.0–30.0)
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
## 2020-01-31 NOTE — ED Notes (Signed)
 ED Patient Education Note     Patient Education Materials Follows:               Alcohol and Drug Clinic Referrals       [facility]   [phone]  [email or web address]     Alcoholics Anonymous [place] ([place] Hotline)  Ph- [phone]     Co-dependents, and Adult Children of Alcoholics  Ph- [phone]     [facility]  [address] [place] [address]   [phone]     [facility]  [address] [place] [address]  [phone]  Inpatient and Outpatient Services for Drug and Alcohol Detox, Methadone Clinic and Detox (prescriptions for methadone are not given). Insured and uninsured.      [facility] Alcohol and Drug Abuse Hotline  Ph- [phone]     [facility] Commission on Alcohol and Drugs  [address], [place] [address]   ph- [phone] Narcotics Anonymous Hotline   [phone]     [facility], [facility]   [address] [place] [address]   [phone], (referral coordinator) [phone]  Call for appointments.     [place]  Department of Health and Human Services: Substance Abuse and Mental Health Services Administration  [employer].pt     National Suicide Prevention Lifeline  1-800-273-TALK 828 620 2233)  www.suicidepreventionlifeline.org     Drug Addiction Assistance Helpline  7757872270      Derm            Cellulitis   Cellulitis is an infection of the deep layers of skin. A break in the skin, such as a cut or scratch, can let bacteria under the skin. If the bacteria get to deep layers of the skin, it can be serious. If not treated, cellulitis can get into the bloodstream and lymph nodes. The infection can then spread throughout the body. This causes serious illness.    Cellulitis causes the affected skin to become red, swollen, warm, and sore. The reddened areas have a visible border. An open  sore may leak fluid (pus). You may have a fever, chills, and pain.    Cellulitis is treated with antibiotics taken for 7 to 10 days. An open sore may be cleaned and covered with cool wet gauze. Symptoms should get better 1 to 2 days after treatment is started. Make sure to take all the antibiotics for the full number of days until they are gone. Keep taking the medicine even if your symptoms go away.    Home care   Follow these tips:     Limit the use of the part of your body with cellulitis.     If the infection is on your leg, keep your leg raised while sitting. This helps reduce swelling.      Take all of the antibiotic medicine exactly as directed until it is gone. Don't miss any doses, especially during the first 7 days. Don?t stop taking the medicine when your symptoms get better.      Keep the affected area clean and dry.     Wash your hands with soap and clean, running water before and after touching your skin. Anyone else who touches your skin should also wash his or her hands. Don't share towels.     Follow-up care   Follow  up with your healthcare provider, or as advised. If your infection doesn't go away on the first antibiotic, your healthcare provider will prescribe a different one.    When to seek medical advice   Call your healthcare provider right away if any of these occur:     Red areas that spread     Swelling or pain that gets worse     Fluid leaking from the skin (pus)     Fever higher of 100.4 F (38.0 C) or higher after 2 days on antibiotics        2000-2021 [employer], [place]. All rights reserved. This information is not intended as a substitute for professional medical care. Always follow your healthcare professional's instructions.     Toxicology            Opiate Overdose           You have been treated for an overdose of opiates such as a prescription pain medicine or heroin.Taking too many opiates is dangerous. They cause breathing to slow and possibly stop.If you  stop breathing for more than 2 to 3 minutes, your heart can stop and you will die. In 2016, the CDC estimated that more than 42,000 people in the [place] died from an opioid overdose.   Signs and symptoms of overdose   Symptoms can depend on how much of the drug and which ones were used. They include:     Trouble breathing or slow irregular breathing; breathing may even stop, which can cause death     Drowsiness, trouble arousing, or coma     Small, pinpoint pupils     Cyanosis. This is when lips and nails appear blue because you don't have enough oxygen in the blood.     Slow heart rate     Low body temperature (hypothermia)     Muscle spasm     Seizures     Death    If your overdose was severe, you may have been given an antidote such as naloxone. The antidote effect lasts for about 1 to 2 hours.If the opiate has not left your system by the time the antidote medicine wears off, your symptoms may return. These symptoms include drowsiness and slow breathing.   If you were addicted and physically dependent on opiates, then naloxone may cause withdrawal symptoms to appear right away.These may consist of:     Body aches     Diarrhea     Abdominal cramps     Nausea     Vomiting     Runny nose     Sneezing     Sweating     Yawning     Restlessness     Irritability     Trembling    These symptoms will go away as the naloxone wears off.   Home care   The following guidelines will help you care for yourself at home:     Rest for the next 12 hours.     Don't drive or operate any vehicle or dangerous equipment until all opioid effects have worn off and you no longer feel sleepy or drowsy.     If you were previously prescribed opioid medicines for pain, don't take any more of this medicine for the next 6 to 8 hours, unless your healthcare provider says it is safe to do so.     If opioids or other drugs were swallowed, you may have been given liquid charcoal to neutralize  those drugs.The charcoal may  cause nausea and vomiting over the next few hours. It will also cause a black color to your stools for the next 1 to 2 days. Usually, you will be given a laxative with the charcoal to speed the removal of any toxins from the digestive tract. This may cause diarrhea for up to 24 hours. If no laxative was given, you may become constipated. If this happens, you may take an over-the-counter laxative or suppository.    Follow-up care   Follow up with your healthcare provider, or as advised if all symptoms don't go away within 24 hours, or if constipation is not eased after 2 doses of laxatives.If your overdose was related to a drug addiction, seek drug counseling. Consider a drug treatment program to help break your habit.   Call 911   Call 911 if any of these occur:     Seizure     Trouble breathing or slow irregular breathing     Chest pain     Sudden weakness on one side of your body or sudden trouble speaking     Very drowsy or trouble awakening     Fainting or loss of consciousness     Rapid heart rate     Very slow heart rate    When to seek medical advice   Call your healthcare provider right away if any of these occur:     Cough with colored sputum     Fever of 100.72F (38C) or higher, or as directed by your healthcare provider     Redness, swelling or tenderness at the heroin injection site (if using IV drugs)     Feeling that you might harm yourself or another        2000-2021 [employer] All rights reserved. This information is not intended as a substitute for professional medical care. Always follow your healthcare professional's instructions.

## 2020-01-31 NOTE — ED Notes (Signed)
 ED Patient Summary              [facility] Emergency Department  [address] [place] [address]  [phone]  Discharge Instructions (Patient)  Name: Diana Cain, Diana Cain  DOB: 02-15-1985                   MRN: [id number]                   FIN: [id number]  Reason For Visit: Drug withdrawal; OD  Final Diagnosis: Heroin overdose     Visit Date: 01/31/2020 14:02:00  Address: [address] [place] [address]  Phone: [phone]     Emergency Department Providers:         Primary Physician:      RUPP, REBECCA ASHLEY-DO      [facility] would like to thank you for allowing us  to assist you with your healthcare needs. The following includes patient education materials and information regarding your injury/illness.     Follow-up Instructions:  You were seen today on an emergency basis. Please contact your primary care doctor for a follow up appointment. If you received a referral to a specialist doctor, it is important you follow-up as instructed.    It is important that you call your follow-up doctor to schedule and confirm the location of your next appointment. Your doctor may practice at multiple locations. The office location of your follow-up appointment may be different to the one written on your discharge instructions.    If you do not have a primary care doctor, please call [phone] for help in finding a [facility] Provider. For help in finding a specialist doctor, please call [phone].    If your condition gets worse before your follow-up with your primary care doctor or specialist, please return to the Emergency Department.      Coronavirus 2019 (COVID-19) Reminders:     Patients age 82 - 73, with parental consent, and patients over age 75 can make an appointment for a COVID-19 vaccine. Patients can contact their [facility] doctors' offices to schedule an appointment to receive the COVID-19 vaccine. Patients who do not have a  [facility] physician can call [phone] to schedule vaccination appointments.      Follow Up Appointments:  Primary Care Provider:     Name: PCP,  NONE     Phone:                  With: Address: When:   Follow up with primary care provider  Within 1 week   Comments:   [facility] you to call there at 0830 AM for a phone interview. Please do this and they can do an intake for detox. A full list of resources has been provided. Please return for any new or concerning symptoms!                   New Medications  Printed Prescriptions  nalOXone (Narcan 4 mg/0.1 mL nasal spray) 4 Milligram Nasal (into the nose) every 2 minutes as needed overdose. 1 spray (4 mg) every 2-3 minutes until patient responds or EMS arrives. Refills: 0.  Last Dose:____________________  sulfamethoxazole-trimethoprim (Bactrim DS 800 mg-160 mg oral tablet) 1 Tabs Oral (given by mouth) 2 times a day for 7 Days. Refills: 0.  Last Dose:____________________  Medications that have not changed  Other Medications  QUEtiapine (  SEROquel XR 300 mg oral tablet, extended release) 1 Tabs Oral (given by mouth) every day., SOUND ALIKE / LOOK ALIKE - VERIFY DRUG  Last Dose:____________________      Allergy Info: Unable to Obtain     Discharge Additional Information          Discharge Patient 01/31/20 20:47:00 EST      Patient Education Materials:                  Alcohol and Drug Clinic Referrals       [facility]   [phone]  www.[facility].com     Alcoholics Anonymous [place] ([place] Hotline)  Ph- ([phone]     Co-dependents, and Adult Children of Alcoholics  Ph- [phone]     [facility]  [address], [place] [address]   [phone]     [facility]  [address] [place] [address]  [phone]  Inpatient and Outpatient Services for Drug and Alcohol Detox, Methadone Clinic and Detox (prescriptions for methadone  are not given). Insured and uninsured.      [facility] Alcohol and Drug Abuse Hotline  Ph- ([phone]     [facility] Commission on Alcohol and Drugs  [address], [place] [address]   ph- [phone] Narcotics Anonymous Hotline   9347548072     [facility] Alcohol [facility], [facility]   [address], [place] [address]   [phone], (referral coordinator) [phone]  Call for appointments.     [place]  Department of Health and Human Services: Substance Abuse and Mental Health Services Administration  SkateOasis.com.pt     National Suicide Prevention Lifeline  1-800-273-TALK 772 437 7864)  www.suicidepreventionlifeline.org     Drug Addiction Assistance Helpline  (678)517-5211              Cellulitis   Cellulitis is an infection of the deep layers of skin. A break in the skin, such as a cut or scratch, can let bacteria under the skin. If the bacteria get to deep layers of the skin, it can be serious. If not treated, cellulitis can get into the bloodstream and lymph nodes. The infection can then spread throughout the body. This causes serious illness.    Cellulitis causes the affected skin to become red, swollen, warm, and sore. The reddened areas have a visible border. An open sore may leak fluid (pus). You may have a fever, chills, and pain.    Cellulitis is treated with antibiotics taken for 7 to 10 days. An open sore may be cleaned and covered with cool wet gauze. Symptoms should get better 1 to 2 days after treatment is started. Make sure to take all the antibiotics for the full number of days until they are gone. Keep taking the medicine even if your symptoms go away.    Home care   Follow these tips:     Limit the use of the part of your body with cellulitis.     If the infection is on your leg, keep your leg raised while sitting. This helps reduce swelling.      Take all of the antibiotic medicine exactly as directed until it is  gone. Don't miss any doses, especially during the first 7 days. Don?t stop taking the medicine when your symptoms get better.  Keep the affected area clean and dry.     Wash your hands with soap and clean, running water before and after touching your skin. Anyone else who touches your skin should also wash his or her hands. Don't share towels.     Follow-up care   Follow up with your healthcare provider, or as advised. If your infection doesn't go away on the first antibiotic, your healthcare provider will prescribe a different one.    When to seek medical advice   Call your healthcare provider right away if any of these occur:     Red areas that spread     Swelling or pain that gets worse     Fluid leaking from the skin (pus)     Fever higher of 100.4 F (38.0 C) or higher after 2 days on antibiotics        2000-2021 [employer], [place]. All rights reserved. This information is not intended as a substitute for professional medical care. Always follow your healthcare professional's instructions.             Opiate Overdose           You have been treated for an overdose of opiates such as a prescription pain medicine or heroin.Taking too many opiates is dangerous. They cause breathing to slow and possibly stop.If you stop breathing for more than 2 to 3 minutes, your heart can stop and you will die. In 2016, the CDC estimated that more than 42,000 people in the [place] died from an opioid overdose.   Signs and symptoms of overdose   Symptoms can depend on how much of the drug and which ones were used. They include:     Trouble breathing or slow irregular breathing; breathing may even stop, which can cause death     Drowsiness, trouble arousing, or coma     Small, pinpoint pupils     Cyanosis. This is when lips and nails appear blue because you don't have enough oxygen in the blood.     Slow heart rate     Low body temperature (hypothermia)     Muscle spasm     Seizures     Death    If your  overdose was severe, you may have been given an antidote such as naloxone. The antidote effect lasts for about 1 to 2 hours.If the opiate has not left your system by the time the antidote medicine wears off, your symptoms may return. These symptoms include drowsiness and slow breathing.   If you were addicted and physically dependent on opiates, then naloxone may cause withdrawal symptoms to appear right away.These may consist of:     Body aches     Diarrhea     Abdominal cramps     Nausea     Vomiting     Runny nose     Sneezing     Sweating     Yawning     Restlessness     Irritability     Trembling    These symptoms will go away as the naloxone wears off.   Home care   The following guidelines will help you care for yourself at home:     Rest for the next 12 hours.     Don't drive or operate any vehicle or dangerous equipment until all opioid effects have worn off and you no longer feel sleepy or drowsy.     If you were previously prescribed opioid medicines  for pain, don't take any more of this medicine for the next 6 to 8 hours, unless your healthcare provider says it is safe to do so.     If opioids or other drugs were swallowed, you may have been given liquid charcoal to neutralize those drugs.The charcoal may cause nausea and vomiting over the next few hours. It will also cause a black color to your stools for the next 1 to 2 days. Usually, you will be given a laxative with the charcoal to speed the removal of any toxins from the digestive tract. This may cause diarrhea for up to 24 hours. If no laxative was given, you may become constipated. If this happens, you may take an over-the-counter laxative or suppository.    Follow-up care   Follow up with your healthcare provider, or as advised if all symptoms don't go away within 24 hours, or if constipation is not eased after 2 doses of laxatives.If your overdose was related to a drug addiction, seek drug counseling. Consider a drug  treatment program to help break your habit.   Call 911   Call 911 if any of these occur:     Seizure     Trouble breathing or slow irregular breathing     Chest pain     Sudden weakness on one side of your body or sudden trouble speaking     Very drowsy or trouble awakening     Fainting or loss of consciousness     Rapid heart rate     Very slow heart rate    When to seek medical advice   Call your healthcare provider right away if any of these occur:     Cough with colored sputum     Fever of 100.40F (38C) or higher, or as directed by your healthcare provider     Redness, swelling or tenderness at the heroin injection site (if using IV drugs)     Feeling that you might harm yourself or another        2000-2021 [employer]. All rights reserved. This information is not intended as a substitute for professional medical care. Always follow your healthcare professional's instructions.     ---------------------------------------------------------------------------------------------------------------------  [facility]) encourages you to self-enroll in the [facility] Patient Portal.  [facility] Patient Portal will allow you to manage your personal health information securely from your own electronic device now and in the future.  To begin your Patient Portal enrollment process, please visit [email or web address] Click on "Sign up now" under [facility].  If you find that you need additional assistance on the [facility] Patient Portal or need a copy of your medical records, please call the [facility] Medical Records Office at [phone].  Comment:
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
## 2020-01-31 NOTE — ED Notes (Signed)
ED Triage Note       ED Secondary Triage Entered On:  01/31/2020 14:44 EST    Performed On:  01/31/2020 14:40 EST by Melrose Nakayama J-RN               General Information   Barriers to Learning :   None evident   Languages :   English   Influenza Vaccine Status :   Not received   COVID-19 Vaccine Status :   Not received   ED Home Meds Section :   Document assessment   [facility] ED Fall Risk Section :   Document assessment   ED Advance Directives Section :   Document assessment   ED Palliative Screen :   N/A (prefilled for <65yo)   Melrose Nakayama J-RN - 01/31/2020 14:40 EST   (As Of: 01/31/2020 14:44:28 EST)   Diagnoses(Active)    Drug withdrawal  Date:   01/31/2020 ; Diagnosis Type:   Reason For Visit ; Confirmation:   Complaint of ; Clinical Dx:   Drug withdrawal ; Classification:   Medical ; Clinical Service:   Emergency medicine ; Code:   PNED ; Probability:   0 ; Diagnosis Code:   [id number]             -    Procedure History   (As Of: 01/31/2020 14:44:28 EST)     [facility] Fall Risk Assessment Tool   Patient confused or disoriented ED Fall :   Yes   Patient intoxicated or sedated ED Fall :   Yes   Patient impaired gait ED Fall :   Unable to assess   Use a mobility assistance device ED Fall :   No   Patient altered elimination ED Fall :   Unable to assess   Melrose Nakayama J-RN - 01/31/2020 14:40 EST   ED Advance Directive   Advance Directive :   No   Melrose Nakayama J-RN - 01/31/2020 14:40 EST   Med Hx   Medication List   (As Of: 01/31/2020 14:44:28 EST)   Normal Order    Sodium Chloride 0.9% intravenous solution Bolus  :   Sodium Chloride 0.9% intravenous solution Bolus ; Status:   Completed ; Ordered As Mnemonic:   Sodium Chloride 0.9% bolus ; Simple Display Line:   500 mL, 2000 mL/hr, IV Piggyback, Once ; Ordering Provider:   RUPP,  REBECCA ASHLEY-DO; Catalog Code:   Sodium Chloride 0.9% ; Order Dt/Tm:   01/31/2020 14:19:29 EST            Home Meds    QUEtiapine  :   QUEtiapine ; Status:    Documented ; Ordered As Mnemonic:   SEROquel XR 300 mg oral tablet, extended release ; Simple Display Line:   300 mg, 1 tabs, Oral, Daily, 30 tabs, 0 Refill(s) ; Catalog Code:   QUEtiapine ; Order Dt/Tm:   01/31/2020 14:43:13 EST ; Comment:   SOUND ALIKE / LOOK ALIKE - VERIFY DRUG

## 2020-01-31 NOTE — ED Notes (Signed)
ED Pre-Arrival Note        Pre-Arrival Summary    Name:  CC2 eta <2,    Current Date:  01/31/2020 14:05:31 EST  Gender:  Female  Date of Birth:    Age:  35 Female  Pre-Arrival Type:  EMS  ETA:  01/31/2020 14:20:00 EST  Primary Care Physician:    Presenting Problem:  OD resolved  Pre-Arrival User:  Roylene Reason  Referring Source:    Location:  [facility]            PreArrival Communication Form  Emergency Department        Additional Patient Information:        Orders:  [    ] CBC                                            [     ] CT Head no contrast  [    ] BMP                                           [     ] CT Abdomen/Pelvis no contrast  [    ] PT/INR                                       [     ] CT Abdomen/Pelvis IV contrast, w/ oral contrast  [    ] Troponin                                   [     ] CT Abdomen/Pelvis IV contrast, no oral contrast  [    ] BNP                                            [     ] See ordersheet  [    ] CXR                                             [     ] Other:__________________________  [    ] EKG

## 2020-01-31 NOTE — ED Notes (Signed)
ED Triage Note       ED Triage Adult Entered On:  01/31/2020 14:16 EST    Performed On:  01/31/2020 14:08 EST by Melrose Nakayama J-RN               Triage   Numeric Rating Pain Scale :   4   Chief Complaint :   Pt brought to ED by EMS/CPD having been found supine in the [employer] bathroom with drug paraphenalia on her person. 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  Pt not given narcan en route because her vitals were reported normal and her state was easily arousable   [place] Mode of Arrival :   Ambulance   Infectious Disease Documentation :   Document assessment   Temperature Oral :   36.7 degC(Converted to: 98.1 degF)    Heart Rate Monitored :   103 bpm (HI)    Respiratory Rate :   15 br/min   Systolic Blood Pressure :   170 mmHg (HI)    Diastolic Blood Pressure :   75 mmHg   SpO2 :   98 %   Oxygen Therapy :   Room air   Patient presentation :   HR > 100   Chief Complaint or Presentation suggest infection :   No   Weight Dosing :   72.3 kg(Converted to: 159 lb 6 oz)    Height :   180.3 cm(Converted to: 5 ft 11 in)    Body Mass Index Dosing :   22 kg/m2   Melrose Nakayama J-RN - 01/31/2020 14:08 EST   DCP GENERIC CODE   Tracking Acuity :   2   Tracking Group :   ED [address] Tracking Group   Melrose Nakayama J-RN - 01/31/2020 14:08 EST   ED General Section :   Document assessment   Pregnancy Status :   Patient denies   ED Allergies Section :   Document assessment   ED Reason for Visit Section :   Document assessment   ED Quick Assessment :   Patient appears awake, alert, oriented to baseline. Skin warm and dry. Moves all extremities. Respiration even and unlabored. Appears in no apparent distress.   Melrose Nakayama J-RN - 01/31/2020 14:08 EST   PTA/Triage Treatments   ED PTA Pre-Arrival Service :   [facility]   Melrose Nakayama J-RN - 01/31/2020 14:08 EST   ID Risk Screen Symptoms   Recent Travel History :   No recent travel   Close Contact with COVID-19 ID :   No   Last 14 days COVID-19 ID :   No   TB Symptom Screen :   No  symptoms   C. diff Symptom/History ID :   Neither of the above   Melrose Nakayama J-RN - 01/31/2020 14:08 EST   Allergies   (As Of: 01/31/2020 14:16:48 EST)     Psycho-Social   Last 3 mo, thoughts killing self/others :   Patient denies   Right click within box for Suspected Abuse policy link. :   None   Feels Safe Where Live :   Yes   ED Behavioral Activity Rating Scale :   4 - Quiet and awake (normal level of activity)   Melrose Nakayama J-RN - 01/31/2020 14:08 EST   ED Reason for Visit   (As Of: 01/31/2020 14:16:48 EST)   Diagnoses(Active)    Drug withdrawal  Date:   01/31/2020 ; Diagnosis  Type:   Reason For Visit ; Confirmation:   Complaint of ; Clinical Dx:   Drug withdrawal ; Classification:   Medical ; Clinical Service:   Emergency medicine ; Code:   PNED ; Probability:   0 ; Diagnosis Code:   [id number]

## 2020-02-01 LAB — POC PREGNANCY UR-QUAL: Preg Test, Ur: NEGATIVE

## 2020-02-01 LAB — POC URINALYSIS, CHEMISTRY
Bilirubin, Urine, POC: NEGATIVE
Blood, UA POC: NEGATIVE
Glucose, UA POC: NEGATIVE mg/dL
Ketones, Urine, POC: NEGATIVE mg/dL
Nitrate, UA POC: POSITIVE — AB
Protein, Urine, POC: NEGATIVE
Specific Gravity, Urine, POC: 1.025 (ref 1.003–1.035)
UROBILIN U POC: 0.2 EU/dL
pH, Urine, POC: 6 (ref 4.5–8.0)

## 2020-02-01 LAB — URINE DRUG SCREEN
Amphetamine Screen, Urine: POSITIVE — AB
Barbiturate Screen, Ur: NEGATIVE
Benzodiazepine Screen, Urine: NEGATIVE
Cannabinoid Scrn, Ur: NEGATIVE
Cocaine Screen Urine: NEGATIVE
Opiate Screen, Urine: NEGATIVE

## 2020-02-26 LAB — CBC WITH AUTO DIFFERENTIAL
Absolute Baso #: 0 10*3/uL (ref 0.0–0.2)
Absolute Eos #: 0.1 10*3/uL (ref 0.0–0.5)
Absolute Lymph #: 1.3 10*3/uL (ref 1.0–3.2)
Absolute Mono #: 0.6 10*3/uL (ref 0.3–1.0)
Basophils %: 0.4 % (ref 0.0–2.0)
Eosinophils %: 1.9 % (ref 0.0–7.0)
Hematocrit: 33 % — ABNORMAL LOW (ref 34.0–47.0)
Hemoglobin: 10.5 g/dL — ABNORMAL LOW (ref 11.5–15.7)
Immature Grans (Abs): 0.02 10*3/uL (ref 0.00–0.06)
Immature Granulocytes: 0.3 % (ref 0.0–0.6)
Lymphocytes: 18.5 % (ref 15.0–45.0)
MCH: 26.1 pg — ABNORMAL LOW (ref 27.0–34.5)
MCHC: 31.8 g/dL — ABNORMAL LOW (ref 32.0–36.0)
MCV: 81.9 fL (ref 81.0–99.0)
MPV: 9.4 fL (ref 7.2–13.2)
Monocytes: 8.9 % (ref 4.0–12.0)
NRBC Absolute: 0 10*3/uL (ref 0.000–0.012)
NRBC Automated: 0 % (ref 0.0–0.2)
Neutrophils %: 70 % (ref 42.0–74.0)
Neutrophils Absolute: 4.7 10*3/uL (ref 1.6–7.3)
Platelets: 449 10*3/uL — ABNORMAL HIGH (ref 140–440)
RBC: 4.03 x10e6/mcL (ref 3.60–5.20)
RDW: 16 % (ref 11.0–16.0)
WBC: 6.8 10*3/uL (ref 3.8–10.6)

## 2020-02-26 LAB — MICROSCOPIC URINALYSIS

## 2020-02-26 LAB — BASIC METABOLIC PANEL
Anion Gap: 9 mmol/L (ref 2–17)
BUN: 12 mg/dL (ref 6–20)
CO2: 25 mmol/L (ref 22–29)
Calcium: 8.6 mg/dL (ref 8.6–10.0)
Chloride: 104 mmol/L (ref 98–107)
Creatinine: 0.6 mg/dL (ref 0.5–1.0)
GFR African American: 139 mL/min/{1.73_m2} (ref >=90)
GFR Non-African American: 120 mL/min/{1.73_m2} (ref >=90)
Glucose: 119 mg/dL — ABNORMAL HIGH (ref 70–99)
OSMOLALITY CALCULATED: 274 mOsm/kg (ref 270–287)
Potassium: 4.5 mmol/L (ref 3.5–5.3)
Sodium: 137 mmol/L (ref 135–145)

## 2020-02-26 LAB — URINE DRUG SCREEN
Amphetamine Screen, Urine: POSITIVE — AB
Barbiturate Screen, Ur: NEGATIVE
Benzodiazepine Screen, Urine: NEGATIVE
Cannabinoid Scrn, Ur: NEGATIVE
Cocaine Screen Urine: NEGATIVE
Opiate Screen, Urine: NEGATIVE

## 2020-02-26 LAB — URINALYSIS W/ RFLX MICROSCOPIC
Bilirubin Urine: NEGATIVE
Glucose, UA: NEGATIVE mg/dL
Ketones, Urine: NEGATIVE mg/dL
Nitrite, Urine: NEGATIVE
Protein, UA: 100 — AB
Specific Gravity, UA: >=1.03 — AB (ref 1.003–1.035)
Urobilinogen, Urine: 0.2 EU/dL
pH, UA: 6 (ref 4.5–8.0)

## 2020-02-26 LAB — TROPONIN T: Troponin T: <0.01 ng/mL (ref 0.000–0.010)

## 2020-02-26 LAB — ADD ON LAB TEST

## 2020-02-26 NOTE — ED Notes (Signed)
ED Patient Education Note     ;Patient Education Materials Follows:               Alcohol and Drug Clinic Referrals       [place] [facility]   [phone]  [email or web address]     Alcoholics Anonymous [place] ([employer] Hotline)  Ph- [phone]     Co-dependents, and Adult Children of Alcoholics  Ph- [phone]     [facility]  [address], [place] [address]   [phone]     [facility]  [address] [place] [address]  [phone]  Inpatient and Outpatient Services for Drug and Alcohol Detox, Methadone Clinic and Detox (prescriptions for methadone are not given). Insured and uninsured.      [facility] Alcohol and Drug Abuse Hotline  Ph- [phone]     [facility] Commission on Alcohol and Drugs  [address], [place] [address]   ph- [phone] Narcotics Anonymous Hotline   [phone]     [facility] [place] [facility] [place]  [address], [place] [address]   [phone], (referral coordinator) [phone]  Call for appointments.     [place] Department of Health and Human Services: Substance Abuse and Mental Health Services Administration  [employer].pt     National Suicide Prevention Lifeline  1-800-273-TALK 414-300-4224)  www.suicidepreventionlifeline.org     Drug Addiction Assistance Helpline  251-078-5906      Mental and Behavioral Health     Opioid Overdose    Opioids are drugs that are often used to treat pain. Opioids include illegal drugs, such as heroin, as well as prescription pain medicines, such as codeine, morphine, hydrocodone, oxycodone, and fentanyl. An opioid overdose happens when you take too much of an opioid. An overdose may be intentional or accidental and can happen with any type of opioid.    The effects of an overdose can be mild, dangerous, or even deadly. Opioid overdose is a medical  emergency.      What are the causes?    This condition may be caused by:   Taking too much of an opioid on purpose.     Taking too much of an opioid by accident.     Using two or more substances that contain opioids at the same time.     Taking an opioid with a substance that affects your heart, breathing, or blood pressure. These include alcohol, tranquilizers, sleeping pills, illegal drugs, and some over-the-counter medicines.      This condition may also happen due to an error made by:   A health care provider who prescribes a medicine.     The pharmacist who fills the prescription order.        What increases the risk?    This condition is more likely in:   Children. They may be attracted to colorful pills. Because of a child's small size, even a small amount of a drug can be dangerous.     Older people. They may be taking many different drugs. Older people may have difficulty reading labels or remembering when they last took their medicine. They may also be more sensitive to the effects of opioids.     People with chronic medical conditions, especially heart, liver, kidney, or neurological diseases.     People who  take an opioid for a long period of time.     People who use:  ? Illegal drugs. IV heroin is especially dangerous.    ? Other substances, including alcohol, while using an opioid.       People who have:  ? A history of drug or alcohol abuse.    ? Certain mental health conditions.    ? A history of previous drug overdoses.       People who take opioids that are not prescribed for them.        What are the signs or symptoms?    Symptoms of this condition depend on the type of opioid and the amount that was taken. Common symptoms include:   Sleepiness or difficulty waking from sleep.     Decrease in attention.     Confusion.     Slurred speech.     Slowed breathing and a slow pulse (bradycardia).     Nausea and vomiting.     Abnormally small pupils.      Signs and symptoms that require emergency  treatment include:   Cold, clammy, and pale skin.     Blue lips and fingernails.     Vomiting.     Gurgling sounds in the throat.     A pulse that is very slow or difficult to detect.     Breathing that is very irregular, slow, noisy, or difficult to detect.     Limp body.     Inability to respond to speech or be awakened from sleep (stupor).     Seizures.        How is this diagnosed?    This condition is diagnosed based on your symptoms and medical history. It is important to tell your health care provider:   About all of the opioids that you took.     When you took the opioids.     Whether you were drinking alcohol or using marijuana, cocaine, or other drugs.      Your health care provider will do a physical exam. This exam may include:   Checking and monitoring your heart rate and rhythm, breathing rate, temperature, and blood pressure (vital signs).     Measuring oxygen levels in your blood.     Checking for abnormally small pupils.      You may also have blood tests or urine tests. You may have X-rays if you are having severe breathing problems.      How is this treated?    This condition requires immediate medical treatment and hospitalization. Treatment is given in the hospital intensive care (ICU) setting. Supporting your vital signs and your breathing is the first step in treating an opioid overdose. Treatment may also include:   Giving salts and minerals (electrolytes) along with fluids through an IV.     Inserting a breathing tube (endotracheal tube) in your airway to help you breathe if you cannot breathe on your own or you are in danger of not being able to breathe on your own.     Giving oxygen through a small tube under your nose.     Passing a tube through your nose and into your stomach (nasogastric tube, or NG tube) to empty your stomach.     Giving medicines that:  ? Increase your blood pressure.    ? Relieve nausea and vomiting.    ? Relieve abdominal pain and cramping.    ? Reverse the effects  of the opioid (  naloxone).       Monitoring your heart and oxygen levels.     Ongoing counseling and mental health support if you intentionally overdosed or used an illegal drug.        Follow these instructions at home:      Medicines     Take over-the-counter and prescription medicines only as told by your health care provider.     Always ask your health care provider about possible side effects and interactions of any new medicine that you start taking.     Keep a list of all the medicines that you take, including over-the-counter medicines. Bring this list with you to all your medical visits.      General instructions     Drink enough fluid to keep your urine pale yellow.     Keep all follow-up visits as told by your health care provider. This is important.        How is this prevented?     Read the drug inserts that come with your opioid pain medicines.     Take medicines only as told by your health care provider. Do not take more medicine than you are told. Do not take medicines more frequently than you are told.     Do not drink alcohol or take sedatives when taking opioids.     Do not use illegal or recreational drugs, including cocaine, ecstasy, and marijuana.     Do not take opioid medicines that are not prescribed for you.     Store all medicines in safety containers that are out of the reach of children.     Get help if you are struggling with:  ? Alcohol or drug use.    ? Depression or another mental health problem.    ? Thoughts of hurting yourself or another person.       Keep the phone number of your local poison control center near your phone or in your mobile phone. In the [place] the hotline of the [facility] is [phone].     If you were prescribed naloxone, make sure you understand how to take it.      Contact a health care provider if you:     Need help understanding how to take your pain medicines.     Feel your medicines are too strong.     Are concerned that your pain  medicines are not working well for your pain.     Develop new symptoms or side effects when you are taking medicines.      Get help right away if:     You or someone else is having symptoms of an opioid overdose. Get help even if you are not sure.     You have serious thoughts about hurting yourself or others.     You have:  ? Chest pain.    ? Difficulty breathing.    ? A loss of consciousness.      These symptoms may represent a serious problem that is an emergency. Do not wait to see if the symptoms will go away. Get medical help right away. Call your local emergency services (911 in the [place] Do not drive yourself to the hospital.    If you ever feel like you may hurt yourself or others, or have thoughts about taking your own life, get help right away. You can go to your nearest emergency department or call:   Your local emergency services (911  in the [place]     A suicide crisis helpline, such as the National Suicide Prevention Lifeline at 713-601-3505. This is open 24 hours a day.        Summary     Opioids are drugs that are often used to treat pain. Opioids include illegal drugs, such as heroin, as well as prescription pain medicines.     An opioid overdose happens when you take too much of an opioid.     Overdoses can be intentional or accidental.     Opioid overdose is very dangerous. It is a life-threatening emergency.     If you or someone you know is experiencing an opioid overdose, get help right away.      This information is not intended to replace advice given to you by your health care provider. Make sure you discuss any questions you have with your health care provider.      Document Revised: 12/11/2017 Document Reviewed: 12/11/2017  Elsevier Patient Education ? 2021 Elsevier Inc.

## 2020-02-26 NOTE — ED Notes (Signed)
ED Triage Note       ED Triage Adult Entered On:  02/26/2020 16:51 EST    Performed On:  02/26/2020 16:47 EST by Tresa Endo, RN, Rex Kras               Triage   Numeric Rating Pain Scale :   3   Chief Complaint :   Pt here s/o herion OD in her bathroom. PT given O2 from EMS and transported. PT aaox4, gcs 15. Airway paitent. PT now reports c/p.   [place] Mode of Arrival :   Ambulance   Infectious Disease Documentation :   Document assessment   Temperature Oral :   36.7 degC(Converted to: 98.1 degF)    Heart Rate Monitored :   119 bpm (HI)    Respiratory Rate :   14 br/min   Systolic Blood Pressure :   120 mmHg   Diastolic Blood Pressure :   76 mmHg   Patient presentation :   None of the above   Chief Complaint or Presentation suggest infection :   No   Weight Dosing :   65 kg(Converted to: 143 lb 5 oz)    Height :   180 cm(Converted to: 5 ft 11 in)    Body Mass Index Dosing :   20 kg/m2   Dwaine Gale - 02/26/2020 16:47 EST   DCP GENERIC CODE   Tracking Acuity :   3   Tracking Group :   ED [address] Tracking Group   Tresa Endo, RN, Rex Kras - 02/26/2020 16:47 EST   ED General Section :   Document assessment   Pregnancy Status :   Patient denies   ED Allergies Section :   Document assessment   ED Reason for Visit Section :   Document assessment   ED Quick Assessment :   Patient appears awake, alert, oriented to baseline. Skin warm and dry. Moves all extremities. Respiration even and unlabored. Appears in no apparent distress.   Tresa Endo RNRex Kras - 02/26/2020 16:47 EST   PTA/Triage Treatments   ED PTA Pre-Arrival Service :   [facility]   [place], [place], Rex Kras - 02/26/2020 16:47 EST   ID Risk Screen Symptoms   Recent Travel History :   No recent travel   Close Contact with COVID-19 ID :   No   Last 14 days COVID-19 ID :   No   TB Symptom Screen :   No symptoms   C. diff Symptom/History ID :   Neither of the above   Dwaine Gale - 02/26/2020 16:47 EST   Allergies   (As Of: 02/26/2020 16:51:52 EST)   Allergies  (Active)   No Known Allergies  Estimated Onset Date:   Unspecified ; Created ByTresa Endo, RN, Rex Kras; Reaction Status:   Active ; Category:   Drug ; Substance:   No Known Allergies ; Type:   Allergy ; Updated By:   Tresa Endo, RN, Rex Kras; Reviewed Date:   02/26/2020 16:51 EST        Psycho-Social   Last 3 mo, thoughts killing self/others :   Patient denies   Right click within box for Suspected Abuse policy link. :   None   Feels Safe Where Live :   Yes   Dwaine Gale - 02/26/2020 16:47 EST   ED Reason for Visit   (As Of: 02/26/2020 16:51:52 EST)   Diagnoses(Active)  Opiate overdose  Date:   02/26/2020 ; Diagnosis Type:   Reason For Visit ; Confirmation:   Complaint of ; Clinical Dx:   Opiate overdose ; Classification:   Medical ; Clinical Service:   Non-Specified ; Code:   PNED ; Probability:   0 ; Diagnosis Code:   [id number]

## 2020-02-26 NOTE — ED Notes (Signed)
ED Triage Note       ED Secondary Triage Entered On:  02/26/2020 19:15 EST    Performed On:  02/26/2020 19:14 EST by Hardie Lora, RN, Zeriah               General Information   Barriers to Learning :   None evident   Languages :   English   Pt. Currently Receiving Radiation :   No   ED Home Meds Section :   Document assessment   [facility] ED Fall Risk Section :   Document assessment   ED Advance Directives Section :   Document assessment   ED Palliative Screen :   N/A (prefilled for <65yo)   Hardie Lora RN, Kristeen Miss - 02/26/2020 19:14 EST   (As Of: 02/26/2020 19:15:00 EST)   Diagnoses(Active)    Opiate overdose  Date:   02/26/2020 ; Diagnosis Type:   Reason For Visit ; Confirmation:   Complaint of ; Clinical Dx:   Opiate overdose ; Classification:   Medical ; Clinical Service:   Non-Specified ; Code:   PNED ; Probability:   0 ; Diagnosis Code:   [id number]      Opiate overdose  Date:   02/26/2020 ; Diagnosis Type:   Reason For Visit ; Confirmation:   Confirmed ; Clinical Dx:   Opiate overdose ; Classification:   Medical ; Clinical Service:   Emergency medicine ; Code:   PNED ; Probability:   0 ; Diagnosis Code:   [id number]             -    Procedure History   (As Of: 02/26/2020 19:15:00 EST)     Phoebe Perch Fall Risk Assessment Tool   Hx of falling last 3 months ED Fall :   Yes (Single mechanical fall)   Patient confused or disoriented ED Fall :   No   Patient intoxicated or sedated ED Fall :   Yes   Patient impaired gait ED Fall :   No   Use a mobility assistance device ED Fall :   No   Patient altered elimination ED Fall :   No   [facility] ED Fall Score :   4    Martyn Malay - 02/26/2020 19:14 EST   ED Advance Directive   Advance Directive :   No   Hardie Lora, RN, Zeriah - 02/26/2020 19:14 EST   Med Hx   Medication List   (As Of: 02/26/2020 19:15:00 EST)   Normal Order    Sodium Chloride 0.9% intravenous solution 1,000 mL  :   Sodium Chloride 0.9% intravenous solution 1,000 mL ;  Status:   Ordered ; Ordered As Mnemonic:   Sodium Chloride 0.9% 1,000 mL ; Simple Display Line:   125 mL/hr, IV, Stop: 02/27/20 3:08:00 EST ; Ordering Provider:   RUPP,  REBECCA ASHLEY-DO; Catalog Code:   Sodium Chloride 0.9% ; Order Dt/Tm:   02/26/2020 19:09:41 EST          Sodium Chloride 0.9% intravenous solution Bolus  :   Sodium Chloride 0.9% intravenous solution Bolus ; Status:   Completed ; Ordered As Mnemonic:   Sodium Chloride 0.9% bolus ; Simple Display Line:   1,000 mL, 2000 mL/hr, IV Piggyback, Once ; Ordering Provider:   RUPP,  REBECCA ASHLEY-DO; Catalog Code:   Sodium Chloride 0.9% ; Order Dt/Tm:   02/26/2020 16:51:53 EST            Prescription/Discharge Order    nalOXone  :  nalOXone ; Status:   Prescribed ; Ordered As Mnemonic:   Narcan 4 mg/0.1 mL nasal spray ; Simple Display Line:   4 mg, Nasal, q49min, 1 spray (4 mg) every 2-3 minutes until patient responds or EMS arrives, PRN: overdose, 1 EA, 0 Refill(s) ; Ordering Provider:   RUPP,  REBECCA ASHLEY-DO; Catalog Code:   nalOXone ; Order Dt/Tm:   01/31/2020 20:50:41 EST            Home Meds    QUEtiapine  :   QUEtiapine ; Status:   Documented ; Ordered As Mnemonic:   SEROquel XR 300 mg oral tablet, extended release ; Simple Display Line:   300 mg, 1 tabs, Oral, Daily, 30 tabs, 0 Refill(s) ; Catalog Code:   QUEtiapine ; Order Dt/Tm:   01/31/2020 14:43:13 EST ; Comment:   SOUND ALIKE / LOOK ALIKE - VERIFY DRUG

## 2020-02-26 NOTE — ED Notes (Signed)
ED Note-Nursing       ED RN Reassessment Entered On:  02/26/2020 21:00 EST    Performed On:  02/26/2020 21:00 EST by Hardie Lora, RN, Kristeen Miss               ED RN Reassessment   ED RN Progress Note :   Pt ambulating and eating without difficulty.   Hardie Lora, RN, Zeriah - 02/26/2020 21:00 EST

## 2020-02-26 NOTE — ED Notes (Signed)
ED Note-Nursing       ED RN Reassessment Entered On:  02/26/2020 20:35 EST    Performed On:  02/26/2020 20:34 EST by Hardie Lora, RN, Kristeen Miss               ED RN Reassessment   ED Patient condition :   Condition slightly improved   [place] Opioid Induced Sedation Scale :   1 = Awake and alert   ED RN Progress Note :   Pt appears to be more awake and is eating.  Pt to ambulate with assistance and then be re-evaluated for discharge.    Hardie Lora, RN, Zeriah - 02/26/2020 20:34 EST

## 2020-02-26 NOTE — ED Provider Notes (Signed)
Opiate overdose        Patient:   Diana Cain, PORCO             MRN: [id number]            FIN: [id number]               Age:   35 years     Sex:  Female     DOB:  1985-11-02   Associated Diagnoses:   Opiate overdose; Head injury   Author:   Markiyah Gahm,  Marlow Hendrie ASHLEY-DO      Basic Information   Time seen: Provider Seen (ST)   ED Provider/Time:    Pearlene Teat,  Jock Mahon ASHLEY-DO / 02/26/2020 16:48  .   Additional information: Chief Complaint from Nursing Triage Note   Chief Complaint  Chief Complaint: Pt here s/o herion OD in her bathroom. PT given O2 from EMS and transported. PT aaox4, gcs 15. Airway paitent. PT now reports c/p. (02/26/20 16:47:00).      History of Present Illness   The patient presents with opiate abuse.  Used heroin abour 345 and then passed out she reports. She hit her head. She then reported to EMS some chest pain. She did not get narcan from EMS  .        Review of Systems   Constitutional symptoms:  No fever, no generalized weakness.    Cardiovascular symptoms:  Chest pain.   Musculoskeletal symptoms:  No back pain,    Neurologic symptoms:  Headache, syncope  .    Psychiatric symptoms:  Substance abuse.             Additional review of systems information: All other systems reviewed and otherwise negative.      Health Status   Allergies:    Allergic Reactions (Selected)  No Known Allergies.   Medications:  (Selected)   Inpatient Medications  Ordered  Sodium Chloride 0.9% bolus: 1,000 mL, 2000 mL/hr, IV Piggyback, Once  Prescriptions  Prescribed  Narcan 4 mg/0.1 mL nasal spray: 4 mg, Nasal, q30mn, 1 spray (4 mg) every 2-3 minutes until patient responds or EMS arrives, PRN: overdose, 1 EA, 0 Refill(s)  Documented Medications  Documented  SEROquel XR 300 mg oral tablet, extended release: 300 mg, 1 tabs, Oral, Daily, 30 tabs, 0 Refill(s).      Past Medical/ Family/ Social History   Medical history: Reviewed as documented in chart.   Surgical history: Reviewed as documented in chart.   Family history: Not  significant.   Social history: Reviewed as documented in chart.   Problem list:    No qualifying data available  .      Physical Examination               Vital Signs   Vital Signs   25/36/1443115:40EST Systolic Blood Pressure 1086mmHg    Diastolic Blood Pressure 76 mmHg    Temperature Oral 36.7 degC    Heart Rate Monitored 119 bpm  HI    Respiratory Rate 14 br/min   .   Measurements   02/26/2020 16:51 EST Body Mass Index est meas 20.06 kg/m2    Body Mass Index Measured 20.06 kg/m2   02/26/2020 16:47 EST Height/Length Measured 180 cm    Weight Dosing 65 kg   .   General:  Alert, no acute distress, sleepy but arousable  .    Skin:  Warm, dry, pink.    Head:  hematoma forehead with abrasion  .  Neck:  Supple, trachea midline, no tenderness.    Eye:  Pupils are equal, round and reactive to light, extraocular movements are intact, normal conjunctiva.    Cardiovascular:  No murmur, Normal peripheral perfusion, Tachycardia, S1, S2.    Respiratory:  Lungs are clear to auscultation, respirations are non-labored, breath sounds are equal, Symmetrical chest wall expansion.    Gastrointestinal:  Soft, Nontender, Non distended.    Back:  Nontender, Normal range of motion, Normal alignment.    Neurological:  Alert and oriented to person, place, time, and situation, No focal neurological deficit observed, CN II-XII intact, normal sensory observed, normal motor observed.    Psychiatric:  Cooperative.      Medical Decision Making   Electrocardiogram:  Emergency Provider interpretation performed by me, See ECG ED Review.    Results review:  Lab results : Lab View   02/26/2020 18:49 EST Pregnancy U QL Negative   02/26/2020 18:21 EST UA Color Dark Yellow    UA Appear Slightly Cloudy    UA Glucose Negative    UA Bili Negative    UA Ketones Negative    UA Spec Grav >=1.030    UA Blood Large    UA pH 6.0    Protein U 100    UA Urobilinogen 0.2 EU/dL    UA Nitrite Negative    UA Leuk Est Trace    WBC U 3-5 /HPF    RBC U 11-20 /HPF    Sq Epi U  Few /LPF    Bacteria U Few    Amorph U Few /HPF    Amphetamine U Positive    Barbiturate U Negative    Benzodiazepin U Negative    Cannabinoid U Negative    Cocaine U Negative    Opiate U Negative   02/26/2020 17:30 EST Estimated Creatinine Clearance 136.85 mL/min   02/26/2020 17:07 EST WBC 6.8 x10e3/mcL    RBC 4.03 x10e6/mcL    Hgb 10.5 g/dL  LOW    HCT 33.0 %  LOW    MCV 81.9 fL    MCH 26.1 pg  LOW    MCHC 31.8 g/dL  LOW    RDW 16.0 %    Platelet 449 x10e3/mcL  HI    MPV 9.4 fL    Neutro Auto 70.0 %    Neutro Absolute 4.7 x10e3/mcL    Immature Grans Percent 0.3 %    Immature Grans Absolute 0.02 x10e3/mcL    Lymph Auto 18.5 %    Lymph Absolute 1.3 x10e3/mcL    Mono Auto 8.9 %    Mono Absolute 0.6 x10e3/mcL    Eosinophil Percent 1.9 %    Eos Absolute 0.1 x10e3/mcL    Basophil Auto 0.4 %    Baso Absolute 0.0 x10e3/mcL    NRBC Absolute Auto 0.000 x10e3/mcL    NRBC Percent Auto 0.0 %    Sodium Lvl 137 mmol/L    Potassium Lvl 4.5 mmol/L    Chloride 104 mmol/L    CO2 25 mmol/L    Glucose Random 119 mg/dL  HI    BUN 12 mg/dL    Creatinine Lvl 0.6 mg/dL    AGAP 9 mmol/L    Osmolality Calc 274 mOsm/kg    Calcium Lvl 8.6 mg/dL    eGFR AA 139 mL/min/1.85m???    eGFR Non-AA 120 mL/min/1.776m??    Trop T Quant <0.010 ng/mL   02/26/2020 16:51 EST Estimated Creatinine Clearance 136.85 mL/min     .   Radiology  results:  Rad Results (ST)   CT Head or Brain w/o Contrast  ?  02/26/20 19:49:30  UNENHANCED HEAD CT 02/26/2020 7:26 PM    COMPARISON: 01/31/2020    CLINICAL INDICATION: Head trauma, mod-severe;ACR Select    TECHNIQUE: Oblique axial imaging from skull base to vertex at 5 mm increments.CT  scanning was performed using radiation dose reduction techniques when  appropriate, per system protocols.      FINDINGS: There is no acute intracranial hemorrhage, mass effect or abnormal  extra-axial fluid collection. No abnormal parenchymal density. The ventricles  are normal and the basilar cisterns are patent. No displaced fracture.  Mastoid  air cells and included paranasal sinuses are clear.    IMPRESSION: Stable unenhanced head CT. No acute intracranial process.  ?  Signed By: Yates Decamp DUBOIS-MD    .      Reexamination/ Reevaluation   Time: 02/26/2020 20:39:00 .   Notes: Much more awake now and reports being hungry. Will trial po and ambulate here. She has never needed dose narcan  .   Time: 02/26/2020 21:01:00 .   Notes: Has walked and is eating. She denies SI/HI just an accidental OD she states  .      Impression and Plan   Diagnosis   Opiate overdose (ICD10-CM T40.601A, Discharge, Medical)   Head injury (ICD10-CM S09.90XA, Discharge, Medical)   Plan   Condition: Stable.    Disposition: Discharged: Time  02/26/2020 20:59:00, to home.    Prescriptions: Launch prescriptions   Pharmacy:  Narcan 4 mg/0.1 mL nasal spray (Prescribe): 4 mg, Nasal, q63mn, 1 spray (4 mg) every 2-3 minutes until patient responds or EMS arrives, PRN: overdose, 1 EA, 0 Refill(s)  .    Patient was given the following educational materials: Opioid Overdose, Alcohol & Drug Clinic Referrals (Custom).    Follow up with: Follow up with primary care provider Within 1 week.    Counseled: Patient, Regarding diagnosis, Regarding diagnostic results, Regarding treatment plan, Regarding prescription, Patient indicated understanding of instructions.    Signature Line     Electronically Signed on 02/26/2020 09:00 PM EST   ________________________________________________   Akira Perusse,  Leamon Palau ASHLEY-DO      Electronically Signed on 02/26/2020 09:01 PM EST   ________________________________________________   Edwen Mclester,  Kashmere Daywalt ASHLEY-DO            Modified by: Nadia Torr,  Winifred Bodiford ASHLEY-DO on 02/26/2020 08:40 PM EST      Modified by: Laurelin Elson,  Chanz Cahall ASHLEY-DO on 02/26/2020 09:00 PM EST      Modified by: Jasiyah Paulding,  Lakai Moree ASHLEY-DO on 02/26/2020 09:01 PM EST

## 2020-02-26 NOTE — ED Notes (Signed)
ED Pre-Arrival Note        Pre-Arrival Summary    Name:  Osvaldo Human,    Current Date:  02/26/2020 16:52:28 EST  Gender:  Female  Date of Birth:    Age:  35  Pre-Arrival Type:  EMS  ETA:  02/26/2020 17:02:00 EST  Primary Care Physician:    Presenting Problem:  OD  Pre-Arrival User:  Tresa Endo, RN, Rex Kras  Referring Source:    Location:  [facility]            PreArrival Communication Form  Emergency Department        Additional Patient Information:        Orders:  [    ] CBC                                            [     ] CT Head no contrast  [    ] BMP                                           [     ] CT Abdomen/Pelvis no contrast  [    ] PT/INR                                       [     ] CT Abdomen/Pelvis IV contrast, w/ oral contrast  [    ] Troponin                                   [     ] CT Abdomen/Pelvis IV contrast, no oral contrast  [    ] BNP                                            [     ] See ordersheet  [    ] CXR                                             [     ] Other:__________________________  [    ] EKG

## 2020-02-26 NOTE — Discharge Summary (Signed)
 ED Clinical Summary                     [facility]  [address]  [place], [place], [address]  [phone]          PERSON INFORMATION  Name: Diana Cain, Diana Cain Age:  35 Years DOB: 02/19/1985   Sex: Female Language: English PCP: PCP,  NONE   Marital Status: Single Phone: [phone] Med Service: MED-Medicine   MRN: [id number] Acct# [id number] Arrival: 02/26/2020 16:45:00   Visit Reason: Opiate overdose; Opiate overdose; OD Acuity: 3 LOS: 000 04:45   Address:    [address] [place] [address]   Diagnosis:    Head injury; Opiate overdose  Medications:          Medications That Were Updated - Follow Current Instructions  Printed Prescriptions  Current nalOXone (Narcan 4 mg/0.1 mL nasal spray) 4 Milligram Nasal (into the nose) every 2 minutes as needed overdose. 1 spray (4 mg) every 2-3 minutes until patient responds or EMS arrives. Refills: 0.  Last Dose:____________________    Other Medications  Current nalOXone (Narcan 4 mg/0.1 mL nasal spray) 4 Milligram Nasal (into the nose) every 2 minutes as needed overdose. 1 spray (4 mg) every 2-3 minutes until patient responds or EMS arrives. Refills: 0.  Last Dose:____________________    Medications that have not changed  Other Medications  QUEtiapine (SEROquel XR 300 mg oral tablet, extended release) 1 Tabs Oral (given by mouth) every day., SOUND ALIKE / LOOK ALIKE - VERIFY DRUG  Last Dose:____________________      Medications Administered During Visit:                Medication Dose Route   Sodium Chloride 0.9% 1000 mL IV Piggyback   Sodium Chloride 0.9% 1000 mL Initial Volume  125 mL/hr IV  Wrist, Left               Allergies      No Known Allergies      Major Tests and Procedures:  The following procedures and tests were performed during your ED visit.  COMMON PROCEDURES%>  COMMON PROCEDURES COMMENTS%>                PROVIDER INFORMATION               Provider Role Assigned Unassigned   RUPP, [facility] ASHLEY-DO ED Provider  02/26/2020 16:48:49    Waylan [facility] ED Nurse 02/26/2020 16:52:35 02/26/2020 18:58:38   Georgi, RN, Marita ED Nurse 02/26/2020 19:02:16        Attending Physician:  RUPP,  REBECCA ASHLEY-DO      Admit Doc  RUPP,  REBECCA ASHLEY-DO     Consulting Doc       VITALS INFORMATION  Vital Sign Triage Latest   Temp Oral ORAL_1%> ORAL%>   Temp Temporal TEMPORAL_1%> TEMPORAL%>   Temp Intravascular INTRAVASCULAR_1%> INTRAVASCULAR%>   Temp Axillary AXILLARY_1%> AXILLARY%>   Temp Rectal RECTAL_1%> RECTAL%>   02 Sat 97 % 99 %   Respiratory Rate RATE_1%> RATE%>   Peripheral Pulse Rate PULSE RATE_1%>100 bpm PULSE RATE%>   Apical Heart Rate HEART RATE_1%> HEART RATE%>   Blood Pressure BLOOD PRESSURE_1%>/ BLOOD PRESSURE_1%>76 mmHg BLOOD PRESSURE%> / BLOOD PRESSURE%>71 mmHg                 Immunizations      No Immunizations Documented This Visit  DISCHARGE INFORMATION   Discharge Disposition: H Outpt-Sent Home   Discharge Location:  Home   Discharge Date and Time:  02/26/2020 21:30:00   ED Checkout Date and Time:  02/26/2020 21:30:00     DEPART REASON INCOMPLETE INFORMATION               Depart Action Incomplete Reason   Interactive View/I&O Recently assessed               Problems      No Problems Documented              Smoking Status      No Smoking Status Documented         PATIENT EDUCATION INFORMATION  Instructions:     Alcohol & Drug Clinic Referrals (Custom); Opioid Overdose     Follow up:                   With: Address: When:   Follow up with primary care provider  Within 1 week              ED PROVIDER DOCUMENTATION     Patient:   Diana Cain, Diana Cain             MRN: [id number]            FIN: [id number]               Age:   35 years     Sex:  Female     DOB:  December 24, 1985   Associated Diagnoses:   Opiate overdose; Head injury   Author:   RUPP,  REBECCA ASHLEY-DO      Basic Information   Time seen: Provider Seen (ST)   ED Provider/Time:    RUPP,  REBECCA ASHLEY-DO / 02/26/2020 16:48  .   Additional information: Chief Complaint  from Nursing Triage Note   Chief Complaint  Chief Complaint: Pt here s/o herion OD in her bathroom. PT given O2 from EMS and transported. PT aaox4, gcs 15. Airway paitent. PT now reports c/p. (02/26/20 16:47:00).      History of Present Illness   The patient presents with opiate abuse.  Used heroin abour 345 and then passed out she reports. She hit her head. She then reported to EMS some chest pain. She did not get narcan from EMS  .        Review of Systems   Constitutional symptoms:  No fever, no generalized weakness.    Cardiovascular symptoms:  Chest pain.   Musculoskeletal symptoms:  No back pain,    Neurologic symptoms:  Headache, syncope  .    Psychiatric symptoms:  Substance abuse.             Additional review of systems information: All other systems reviewed and otherwise negative.      Health Status   Allergies:    Allergic Reactions (Selected)  No Known Allergies.   Medications:  (Selected)   Inpatient Medications  Ordered  Sodium Chloride 0.9% bolus: 1,000 mL, 2000 mL/hr, IV Piggyback, Once  Prescriptions  Prescribed  Narcan 4 mg/0.1 mL nasal spray: 4 mg, Nasal, q2min, 1 spray (4 mg) every 2-3 minutes until patient responds or EMS arrives, PRN: overdose, 1 EA, 0 Refill(s)  Documented Medications  Documented  SEROquel XR 300 mg oral tablet, extended release: 300 mg, 1 tabs, Oral, Daily, 30 tabs, 0 Refill(s).      Past Medical/ Family/ Social History   Medical history: Reviewed as  documented in chart.   Surgical history: Reviewed as documented in chart.   Family history: Not significant.   Social history: Reviewed as documented in chart.   Problem list:    No qualifying data available  .      Physical Examination               Vital Signs   Vital Signs   02/26/2020 16:47 EST Systolic Blood Pressure 120 mmHg    Diastolic Blood Pressure 76 mmHg    Temperature Oral 36.7 degC    Heart Rate Monitored 119 bpm  HI    Respiratory Rate 14 br/min   .   Measurements   02/26/2020 16:51 EST Body Mass Index est meas  20.06 kg/m2    Body Mass Index Measured 20.06 kg/m2   02/26/2020 16:47 EST Height/Length Measured 180 cm    Weight Dosing 65 kg   .   General:  Alert, no acute distress, sleepy but arousable  .    Skin:  Warm, dry, pink.    Head:  hematoma forehead with abrasion  .   Neck:  Supple, trachea midline, no tenderness.    Eye:  Pupils are equal, round and reactive to light, extraocular movements are intact, normal conjunctiva.    Cardiovascular:  No murmur, Normal peripheral perfusion, Tachycardia, S1, S2.    Respiratory:  Lungs are clear to auscultation, respirations are non-labored, breath sounds are equal, Symmetrical chest wall expansion.    Gastrointestinal:  Soft, Nontender, Non distended.    Back:  Nontender, Normal range of motion, Normal alignment.    Neurological:  Alert and oriented to person, place, time, and situation, No focal neurological deficit observed, CN II-XII intact, normal sensory observed, normal motor observed.    Psychiatric:  Cooperative.      Medical Decision Making   Electrocardiogram:  Emergency Provider interpretation performed by me, See ECG ED Review.    Results review:  Lab results : Lab View   02/26/2020 18:49 EST Pregnancy U QL Negative   02/26/2020 18:21 EST UA Color Dark Yellow    UA Appear Slightly Cloudy    UA Glucose Negative    UA Bili Negative    UA Ketones Negative    UA Spec Grav >=1.030    UA Blood Large    UA pH 6.0    Protein U 100    UA Urobilinogen 0.2 EU/dL    UA Nitrite Negative    UA Leuk Est Trace    WBC U 3-5 /HPF    RBC U 11-20 /HPF    Sq Epi U Few /LPF    Bacteria U Few    Amorph U Few /HPF    Amphetamine U Positive    Barbiturate U Negative    Benzodiazepin U Negative    Cannabinoid U Negative    Cocaine U Negative    Opiate U Negative   02/26/2020 17:30 EST Estimated Creatinine Clearance 136.85 mL/min   02/26/2020 17:07 EST WBC 6.8 x10e3/mcL    RBC 4.03 x10e6/mcL    Hgb 10.5 g/dL  LOW    HCT 66.9 %  LOW    MCV 81.9 fL    MCH 26.1 pg  LOW    MCHC 31.8 g/dL  LOW    RDW  83.9 %    Platelet 449 x10e3/mcL  HI    MPV 9.4 fL    Neutro Auto 70.0 %    Neutro Absolute 4.7 x10e3/mcL    Immature Grans Percent  0.3 %    Immature Grans Absolute 0.02 x10e3/mcL    Lymph Auto 18.5 %    Lymph Absolute 1.3 x10e3/mcL    Mono Auto 8.9 %    Mono Absolute 0.6 x10e3/mcL    Eosinophil Percent 1.9 %    Eos Absolute 0.1 x10e3/mcL    Basophil Auto 0.4 %    Baso Absolute 0.0 x10e3/mcL    NRBC Absolute Auto 0.000 x10e3/mcL    NRBC Percent Auto 0.0 %    Sodium Lvl 137 mmol/L    Potassium Lvl 4.5 mmol/L    Chloride 104 mmol/L    CO2 25 mmol/L    Glucose Random 119 mg/dL  HI    BUN 12 mg/dL    Creatinine Lvl 0.6 mg/dL    AGAP 9 mmol/L    Osmolality Calc 274 mOsm/kg    Calcium Lvl 8.6 mg/dL    eGFR AA 860 fO/fpw/8.26f    eGFR Non-AA 120 mL/min/1.51m    Trop T Quant <0.010 ng/mL   02/26/2020 16:51 EST Estimated Creatinine Clearance 136.85 mL/min     .   Radiology results:  Rad Results (ST)   CT Head or Brain w/o Contrast  ?  02/26/20 19:49:30  UNENHANCED HEAD CT 02/26/2020 7:26 PM    COMPARISON: 01/31/2020    CLINICAL INDICATION: Head trauma, mod-severe;ACR Select    TECHNIQUE: Oblique axial imaging from skull base to vertex at 5 mm increments.CT  scanning was performed using radiation dose reduction techniques when  appropriate, per system protocols.      FINDINGS: There is no acute intracranial hemorrhage, mass effect or abnormal  extra-axial fluid collection. No abnormal parenchymal density. The ventricles  are normal and the basilar cisterns are patent. No displaced fracture. Mastoid  air cells and included paranasal sinuses are clear.    IMPRESSION: Stable unenhanced head CT. No acute intracranial process.  ?  Signed By: VICTORY KOTYK DUBOIS-MD    .      Reexamination/ Reevaluation   Time: 02/26/2020 20:39:00 .   Notes: Much more awake now and reports being hungry. Will trial po and ambulate here. She has never needed dose narcan  .   Time: 02/26/2020 21:01:00 .   Notes: Has walked and is eating. She denies  SI/HI just an accidental OD she states  .      Impression and Plan   Diagnosis   Opiate overdose (ICD10-CM T40.601A, Discharge, Medical)   Head injury (ICD10-CM S09.90XA, Discharge, Medical)   Plan   Condition: Stable.    Disposition: Discharged: Time  02/26/2020 20:59:00, to home.    Prescriptions: Launch prescriptions   Pharmacy:  Narcan 4 mg/0.1 mL nasal spray (Prescribe): 4 mg, Nasal, q2min, 1 spray (4 mg) every 2-3 minutes until patient responds or EMS arrives, PRN: overdose, 1 EA, 0 Refill(s)  .    Patient was given the following educational materials: Opioid Overdose, Alcohol & Drug Clinic Referrals (Custom).    Follow up with: Follow up with primary care provider Within 1 week.    Counseled: Patient, Regarding diagnosis, Regarding diagnostic results, Regarding treatment plan, Regarding prescription, Patient indicated understanding of instructions.

## 2020-02-26 NOTE — ED Notes (Signed)
 ED Patient Summary       ;       [facility]  [address], [place] [address]  [phone]  Discharge Instructions (Patient)  Name: Diana Cain, Diana Cain  DOB: 07/07/1985                   MRN: [id number]                   FIN: [id number]  Reason For Visit: Opiate overdose; Opiate overdose; OD  Final Diagnosis: Head injury; Opiate overdose     Visit Date: 02/26/2020 16:45:00  Address: [address] [place] [address]  Phone: [phone]     Emergency Department Providers:         Primary Physician:      RUPP, REBECCA ASHLEY-DO      [facility] would like to thank you for allowing us  to assist you with your healthcare needs. The following includes patient education materials and information regarding your injury/illness.     Follow-up Instructions:  You were seen today on an emergency basis. Please contact your primary care doctor for a follow up appointment. If you received a referral to a specialist doctor, it is important you follow-up as instructed.    It is important that you call your follow-up doctor to schedule and confirm the location of your next appointment. Your doctor may practice at multiple locations. The office location of your follow-up appointment may be different to the one written on your discharge instructions.    If you do not have a primary care doctor, please call [phone] for help in finding a [facility] Provider. For help in finding a specialist doctor, please call [phone].    If your condition gets worse before your follow-up with your primary care doctor or specialist, please return to the Emergency Department.      Coronavirus 2019 (COVID-19) Reminders:     Patients age 53 - 66, with parental consent, and patients over age 50 can make an appointment for a COVID-19 vaccine. Patients can contact their [facility] doctors' offices to schedule an appointment to receive the  COVID-19 vaccine. Patients who do not have a [facility] physician can call [phone] to schedule vaccination appointments.      Follow Up Appointments:  Primary Care Provider:     Name: PCP,  NONE     Phone:                  With: Address: When:   Follow up with primary care provider  Within 1 week                   Medications That Were Updated - Follow Current Instructions  Printed Prescriptions  Current nalOXone (Narcan 4 mg/0.1 mL nasal spray) 4 Milligram Nasal (into the nose) every 2 minutes as needed overdose. 1 spray (4 mg) every 2-3 minutes until patient responds or EMS arrives. Refills: 0.  Last Dose:____________________    Other Medications  Current nalOXone (Narcan 4 mg/0.1 mL nasal spray) 4 Milligram Nasal (into the nose) every 2 minutes as needed overdose. 1 spray (4 mg) every 2-3 minutes until patient responds or EMS arrives. Refills: 0.  Last Dose:____________________    Medications that have not changed  Other Medications  QUEtiapine (SEROquel XR 300 mg oral tablet, extended release) 1 Tabs Oral (given by mouth) every day., SOUND  ALIKE / LOOK ALIKE - VERIFY DRUG  Last Dose:____________________      Allergy Info: No Known Allergies     Discharge Additional Information          Discharge Patient 02/26/20 21:01:00 EST      Patient Education Materials:                  Alcohol and Drug Clinic Referrals       [facility]   [phone]  www.[facility].com     Alcoholics Anonymous [place] ([employer] Hotline)  Ph- [phone]     Co-dependents, and Adult Children of Alcoholics  Ph- [phone]     [facility]  [address] [place] [address]   [phone]     [facility]  [address] [place] [address]  [phone]  Inpatient and Outpatient Services for Drug and Alcohol Detox, Methadone Clinic and Detox (prescriptions for methadone are not given). Insured and uninsured.       [facility] Alcohol and Drug Abuse Hotline  Ph- [phone]     [facility] Commission on Alcohol and Drugs  [address], [place] [address]   ph- [phone] Narcotics Anonymous Hotline   ([phone]     [facility], [facility]   [address], [place] [address]   [phone], (referral coordinator) [phone]  Call for appointments.     [place]  Department of Health and Human Services: Substance Abuse and Mental Health Services Administration  SkateOasis.com.pt     National Suicide Prevention Lifeline  1-800-273-TALK (716)631-6169)  www.suicidepreventionlifeline.org     Drug Addiction Assistance Helpline  (708)560-2169       Opioid Overdose    Opioids are drugs that are often used to treat pain. Opioids include illegal drugs, such as heroin, as well as prescription pain medicines, such as codeine, morphine, hydrocodone, oxycodone, and fentanyl. An opioid overdose happens when you take too much of an opioid. An overdose may be intentional or accidental and can happen with any type of opioid.    The effects of an overdose can be mild, dangerous, or even deadly. Opioid overdose is a medical emergency.      What are the causes?    This condition may be caused by:   Taking too much of an opioid on purpose.     Taking too much of an opioid by accident.     Using two or more substances that contain opioids at the same time.     Taking an opioid with a substance that affects your heart, breathing, or blood pressure. These include alcohol, tranquilizers, sleeping pills, illegal drugs, and some over-the-counter medicines.      This condition may also happen due to an error made by:   A health care provider who prescribes a medicine.     The pharmacist who fills the prescription order.        What increases the risk?    This condition is more likely in:   Children. They may be attracted to colorful pills. Because of a child's small  size, even a small amount of a drug can be dangerous.     Older people. They may be taking many different drugs. Older people may have difficulty reading labels or remembering when they last took their  medicine. They may also be more sensitive to the effects of opioids.     People with chronic medical conditions, especially heart, liver, kidney, or neurological diseases.     People who take an opioid for a long period of time.     People who use:  ? Illegal drugs. IV heroin is especially dangerous.    ? Other substances, including alcohol, while using an opioid.       People who have:  ? A history of drug or alcohol abuse.    ? Certain mental health conditions.    ? A history of previous drug overdoses.       People who take opioids that are not prescribed for them.        What are the signs or symptoms?    Symptoms of this condition depend on the type of opioid and the amount that was taken. Common symptoms include:   Sleepiness or difficulty waking from sleep.     Decrease in attention.     Confusion.     Slurred speech.     Slowed breathing and a slow pulse (bradycardia).     Nausea and vomiting.     Abnormally small pupils.      Signs and symptoms that require emergency treatment include:   Cold, clammy, and pale skin.     Blue lips and fingernails.     Vomiting.     Gurgling sounds in the throat.     A pulse that is very slow or difficult to detect.     Breathing that is very irregular, slow, noisy, or difficult to detect.     Limp body.     Inability to respond to speech or be awakened from sleep (stupor).     Seizures.        How is this diagnosed?    This condition is diagnosed based on your symptoms and medical history. It is important to tell your health care provider:   About all of the opioids that you took.     When you took the opioids.     Whether you were drinking alcohol or using marijuana, cocaine, or other drugs.      Your health care provider will do a physical exam. This exam may  include:   Checking and monitoring your heart rate and rhythm, breathing rate, temperature, and blood pressure (vital signs).     Measuring oxygen levels in your blood.     Checking for abnormally small pupils.      You may also have blood tests or urine tests. You may have X-rays if you are having severe breathing problems.      How is this treated?    This condition requires immediate medical treatment and hospitalization. Treatment is given in the hospital intensive care (ICU) setting. Supporting your vital signs and your breathing is the first step in treating an opioid overdose. Treatment may also include:   Giving salts and minerals (electrolytes) along with fluids through an IV.     Inserting a breathing tube (endotracheal tube) in your airway to help you breathe if you cannot breathe on your own or you are in danger of not being able to breathe on your own.     Giving oxygen through a small tube under your nose.     Passing a tube through your nose and into your stomach (nasogastric tube, or NG tube) to empty your stomach.     Giving medicines that:  ?  Increase your blood pressure.    ? Relieve nausea and vomiting.    ? Relieve abdominal pain and cramping.    ? Reverse the effects of the opioid (naloxone).       Monitoring your heart and oxygen levels.     Ongoing counseling and mental health support if you intentionally overdosed or used an illegal drug.        Follow these instructions at home:      Medicines     Take over-the-counter and prescription medicines only as told by your health care provider.     Always ask your health care provider about possible side effects and interactions of any new medicine that you start taking.     Keep a list of all the medicines that you take, including over-the-counter medicines. Bring this list with you to all your medical visits.      General instructions     Drink enough fluid to keep your urine pale yellow.     Keep all follow-up visits as told by your health care  provider. This is important.        How is this prevented?     Read the drug inserts that come with your opioid pain medicines.     Take medicines only as told by your health care provider. Do not take more medicine than you are told. Do not take medicines more frequently than you are told.     Do not drink alcohol or take sedatives when taking opioids.     Do not use illegal or recreational drugs, including cocaine, ecstasy, and marijuana.     Do not take opioid medicines that are not prescribed for you.     Store all medicines in safety containers that are out of the reach of children.     Get help if you are struggling with:  ? Alcohol or drug use.    ? Depression or another mental health problem.    ? Thoughts of hurting yourself or another person.       Keep the phone number of your local poison control center near your phone or in your mobile phone. In the [place] the hotline of the [facility] is [phone].     If you were prescribed naloxone, make sure you understand how to take it.      Contact a health care provider if you:     Need help understanding how to take your pain medicines.     Feel your medicines are too strong.     Are concerned that your pain medicines are not working well for your pain.     Develop new symptoms or side effects when you are taking medicines.      Get help right away if:     You or someone else is having symptoms of an opioid overdose. Get help even if you are not sure.     You have serious thoughts about hurting yourself or others.     You have:  ? Chest pain.    ? Difficulty breathing.    ? A loss of consciousness.      These symptoms may represent a serious problem that is an emergency. Do not wait to see if the symptoms will go away. Get medical help right away. Call your local emergency services (911 in the [place] Do not drive yourself to the hospital.    If you ever feel like you may hurt yourself  or others, or have thoughts about taking your own  life, get help right away. You can go to your nearest emergency department or call:   Your local emergency services (911 in the [place]     A suicide crisis helpline, such as the National Suicide Prevention Lifeline at (574)824-9109. This is open 24 hours a day.        Summary     Opioids are drugs that are often used to treat pain. Opioids include illegal drugs, such as heroin, as well as prescription pain medicines.     An opioid overdose happens when you take too much of an opioid.     Overdoses can be intentional or accidental.     Opioid overdose is very dangerous. It is a life-threatening emergency.     If you or someone you know is experiencing an opioid overdose, get help right away.      This information is not intended to replace advice given to you by your health care provider. Make sure you discuss any questions you have with your health care provider.      Document Revised: 12/11/2017 Document Reviewed: 12/11/2017  Elsevier Patient Education ? 2021 Elsevier Inc.      ---------------------------------------------------------------------------------------------------------------------  [facility] encourages you to self-enroll in the [facility] Patient Portal.  [facility] Patient Portal will allow you to manage your personal health information securely from your own electronic device now and in the future.  To begin your Patient Portal enrollment process, please visit [email or web address] Click on "Sign up now" under [facility].  If you find that you need additional assistance on the [facility] Patient Portal or need a copy of your medical records, please call the [facility] Medical Records Office at [phone].  Comment:

## 2020-02-27 LAB — PREGNANCY, URINE: Pregnancy, Urine: NEGATIVE

## 2020-07-15 LAB — DIFFERENTIAL, MANUAL
Absolute Baso #: 0.1 10*3/uL (ref 0.0–0.2)
Absolute Eos #: 0.3 10*3/uL (ref 0.0–0.5)
Absolute Lymph #: 2.3 10*3/uL (ref 1.0–3.2)
Absolute Mono #: 0.4 10*3/uL (ref 0.3–1.0)
Basophils %: 2 % (ref 0–2)
Eosinophils %: 4 % (ref 0–7)
Lymphocytes: 36 % (ref 15–45)
Monocytes: 6 % (ref 4–12)
Neutrophils %. Manual count: 52 % (ref 42–74)
Neutrophils Absolute: 3.4 10*3/uL (ref 1.6–7.3)
Platelet Estimate: ADEQUATE
RBC Morphology: NORMAL

## 2020-07-15 LAB — BASIC METABOLIC PANEL
Anion Gap: 7 mmol/L (ref 2–17)
BUN: 18 mg/dL (ref 6–20)
CO2: 27 mmol/L (ref 22–29)
Calcium: 8.7 mg/dL (ref 8.6–10.0)
Chloride: 98 mmol/L (ref 98–107)
Creatinine: 0.8 mg/dL (ref 0.5–1.0)
Est, Glom Filt Rate: 100 mL/min/1.73m?? (ref >=90)
Glucose: 91 mg/dL (ref 70–99)
OSMOLALITY CALCULATED: 266 mOsm/kg — ABNORMAL LOW (ref 270–287)
Potassium: 4.2 mmol/L (ref 3.5–5.3)
Sodium: 132 mmol/L — ABNORMAL LOW (ref 135–145)

## 2020-07-15 LAB — ETHANOL: Ethanol Lvl: NOT DETECTED mg/dL (ref 0.0–10.0)

## 2020-07-15 LAB — MAGNESIUM: Magnesium: 2.4 mg/dL (ref 1.6–2.6)

## 2020-07-15 LAB — CBC WITH AUTO DIFFERENTIAL
Hematocrit: 32.2 % — ABNORMAL LOW (ref 34.0–47.0)
Hemoglobin: 10.6 g/dL — ABNORMAL LOW (ref 11.5–15.7)
MCH: 26.2 pg — ABNORMAL LOW (ref 27.0–34.5)
MCHC: 32.9 g/dL (ref 32.0–36.0)
MCV: 79.5 fL — ABNORMAL LOW (ref 81.0–99.0)
MPV: 10.8 fL (ref 7.2–13.2)
Platelets: 338 10*3/uL (ref 140–440)
RBC: 4.05 x10e6/mcL (ref 3.60–5.20)
RDW: 14.4 % (ref 11.0–16.0)
WBC: 6.5 10*3/uL (ref 3.8–10.6)

## 2020-07-15 LAB — TROPONIN T: Troponin T: <0.01 ng/mL (ref 0.000–0.010)

## 2020-07-15 NOTE — ED Provider Notes (Signed)
Opiate overdose        Patient:   Diana Cain, Diana Cain             MRN: [id number]            FIN: [id number]               Age:   35 years     Sex:  Female     DOB:  03-23-1985   Associated Diagnoses:   Opioid overdose   Author:   Avie Arenas P-MD      Basic Information   Time seen: Provider Seen (ST)   ED Provider/Time:    Avie Arenas P-MD / 07/15/2020 12:40  .   History source: Patient.   Additional information: Chief Complaint from Nursing Triage Note   Chief Complaint   No qualifying data available.Marland Kitchen      History of Present Illness   35 year old female presents to the emergency department EMS after being found in the bathroom of a [employer].  Per EMS, the patient had decreased respiratory effort.  First responders gave the patient Narcan with improvement in her respiratory effort.  Patient remains altered.  Per EMS, syringes/drug paraphenalia were found around the patient..        Review of Systems             Additional review of systems information: Unable to obtain due to: Clinical condition.      Health Status   Allergies:    Allergic Reactions (Selected)  No Known Allergies.   Medications:  (Selected)   Inpatient Medications  Ordered  Narcan injection: 0.4 mg, 1 mL, IV Push, Once  Sodium Chloride 0.9% bolus: 1,000 mL, 2000 mL/hr, IV Piggyback, Once  Prescriptions  Prescribed  Narcan 4 mg/0.1 mL nasal spray: 4 mg, Nasal, q73mn, 1 spray (4 mg) every 2-3 minutes until patient responds or EMS arrives, PRN: overdose, 1 EA, 0 Refill(s)  Narcan 4 mg/0.1 mL nasal spray: 4 mg, Nasal, q257m, 1 spray (4 mg) every 2-3 minutes until patient responds or EMS arrives, PRN: overdose, 1 EA, 0 Refill(s)  Documented Medications  Documented  SEROquel XR 300 mg oral tablet, extended release: 300 mg, 1 tabs, Oral, Daily, 30 tabs, 0 Refill(s).      Past Medical/ Family/ Social History   Surgical history: Reviewed as documented in chart.   Social history:    Social & Psychosocial Habits    No Data Available  .   Problem list:    No  qualifying data available  .      Physical Examination   General:  No acute distress, Patient appears altered but becomes more responsive with painful stimuli.    Skin:  Warm, dry.    Head:  Normocephalic, atraumatic.    Neck:  Supple.   Cardiovascular:  Regular rate and rhythm, Normal peripheral perfusion.    Respiratory:  Lungs are clear to auscultation, respirations are non-labored, breath sounds are equal, Symmetrical chest wall expansion.    Gastrointestinal:  Soft, Nontender, Non distended.    Psychiatric:  Cooperative, Altered.       Medical Decision Making   Rationale:  [age]ear old female with likely opioid overdose.  Patient was found to have drug paraphernalia around her in the [employer] bathroom where she was found.  Patient did have some response to the Narcan.  However, patient was still altered.  We will check a CT of her head as the patient was found on the floor and to  continue to be altered.  CT of the head is unremarkable.  Lab work unremarkable.  Patient did respond appropriately to the second dose of Narcan IV.  We will observe the patient in the emergency department.  Patient denies SI/HI.  Marland Kitchen   Orders  Launch Order Profile (Selected)   Inpatient Orders  Ordered  Fall Risk Precautions:   Patient Specific Fall Safety Measures:   Completed  BMP:   CBC and Auto Diff WBC Count:   CT Head or Brain w/o Contrast:   Communication to Nursing:   Differential Manual:   ED Assessment Adult:   ED Secondary Triage:   ED Triage Adult:   ETOH:   Magnesium Level:   Narcan injection: 0.4 mg, 1 mL, IV Push, Once  PCXR:   Patient Specific Fall Safety Measures:   Saline Lock Insert:   Sodium Chloride 0.9% bolus: 1,000 mL, 2000 mL/hr, IV Piggyback, Once  Troponin T:   Completed (Signed)  EKG: .   Electrocardiogram:  Emergency Provider interpretation performed by me, time 07/15/2020 12:51:00, rate 78, sinus rhythm, normal axis, no concerning ST elevations.    Results review:  Lab results : Lab View   07/15/2020 13:22 EDT  Estimated Creatinine Clearance 97.01 mL/min   07/15/2020 12:57 EDT WBC 6.5 x10e3/mcL    RBC 4.05 x10e6/mcL    Hgb 10.6 g/dL  LOW    HCT 32.2 %  LOW    MCV 79.5 fL  LOW    MCH 26.2 pg  LOW    MCHC 32.9 g/dL    RDW 14.4 %    Platelet 338 x10e3/mcL    MPV 10.8 fL    Neutrophil M Pct 52 %    Lymphcyte M Pct 36 %    Monocyte M Pct 6 %    Eosinophil M Pct 4 %    Basophil M Pct 2 %    Neutrophil M # 3.4 x10e3/mcL    Lymphocyte M # 2.3 x10e3/mcL    Monocyte M # 0.4 x10e3/mcL    Eosinophil M # 0.3 x10e3/mcL    Basophil M # 0.1 x10e3/mcL    PLT estimate Adequate    RBC morphology Normal    Sodium Lvl 132 mmol/L  LOW    Potassium Lvl 4.2 mmol/L    Chloride 98 mmol/L    CO2 27 mmol/L    Glucose Random 91 mg/dL    BUN 18 mg/dL    Creatinine Lvl 0.8 mg/dL    AGAP 7 mmol/L    Osmolality Calc 266 mOsm/kg  LOW    Calcium Lvl 8.7 mg/dL    eGFR 100 mL/min/1.81m???    Magnesium Lvl 2.4 mg/dL    Trop T Quant <0.010 ng/mL    Ethanol Lvl None Detected mg/dL   07/15/2020 12:43 EDT Estimated Creatinine Clearance 129.34 mL/min   .   Radiology results:  Rad Results (ST)   CT Head or Brain w/o Contrast  ?  07/15/20 13:32:01  CT Head without contrast. 07/15/20:    COMPARISON: 02/25/2018    INDICATION: Delirium, overdose    TECHNIQUE: 4.8 mm axial images from the convexity to the skull base without  contrast. CT scanning was performed using radiation dose reduction techniques  when appropriate, per system protocols    FINDINGS: No mass or shift of midline structure. GPearline Cablesand white matter  structures are unremarkable. The ventricular system and CSF pathways are normal.  The calvarium is intact. Limited sinuses and mastoids are clear.    IMPRESSION:  1. No mass, acute infarct, or hemorrhage  ?  Signed By: Cherene Julian CHRISMAN-MD  ?  **************************************************  XR Chest 1 View Portable  ?  07/15/20 13:28:54  Portable AP chest: 07/15/20    Comparison: None    Indication: Other abnormalities of breathing    FINDINGS: No frank  consolidation. No pneumothorax. Cardiomediastinal contour is  unremarkable.    IMPRESSION:  No evidence for acute cardiopulmonary process.    If you have questions regarding this report, please feel free to call me  directly using [employer].  ?  Signed By: Clent Ridges  .      Reexamination/ Reevaluation   Time: 07/15/2020 13:09:00 .   Notes: patient is more awake, alert, and appropriate after the narcan in the ED. patient with no midline c-spine ttp. patient with no midline back ttp. patient talking on her cell phone. patient denies SOB. no CP. no HA. patient denies SI/HI. no fever. no V/D. Marland Kitchen   Time: 07/15/2020 14:29:00 .   Notes: patient more somnolent. patient awakes with verbal stimuli but goes back to sleep. will give another dose of narcan..   Time: 07/15/2020 14:46:00 .   Notes: patient more awake and alert after second dose of narcan..   Time: 07/15/2020 15:13:00 .   Notes: Patient is awake, alert, and appropriate.  Patient is demanding to leave.  Her boyfriend is in the room and notes that he will drive the patient home.  He agrees to watch the patient closely for the next several hours.  He is requesting a prescription for Narcan.  Patient expressed understanding of the possible risks of death, permanent disability, stroke, heart attack from leaving the emergency department prematurely.  I did advise to observe the patient for another hour or 2.  Patient refuses and demands to leave..      Impression and Plan   Diagnosis   Opioid overdose (ICD10-CM T40.2X1A, Discharge, Medical)   Plan   Condition: Stable.    Disposition: Discharged: Time  07/15/2020 15:16:00, to home.    Prescriptions: Launch prescriptions   Pharmacy:  Narcan 4 mg/0.1 mL nasal spray (Prescribe): 4 mg, Nasal, q58mn, 1 spray (4 mg) every 2-3 minutes until patient responds or EMS arrives, PRN: overdose, 1 EA, 0 Refill(s).    Patient was given the following educational materials: Opioid Overdose, Alcohol & Drug Clinic Referrals (Custom).    Follow  up with: Follow up with primary care provider Within 2 to 4 days Please follow-up with your primary care physician.  Please return to the emergency department should your symptoms persist or worsen, fever, difficulty breathing, chest pain, headache, vomiting, and/or develop any other concerns..    Counseled: Patient, Regarding diagnostic results, Regarding treatment plan, Patient indicated understanding of instructions.    Signature Line     Electronically Signed on 07/15/2020 03:16 PM EDT   ________________________________________________   SAvie ArenasP-MD               Modified by: SAvie ArenasP-MD on 07/15/2020 12:58 PM EDT      Modified by: SAvie ArenasP-MD on 07/15/2020 12:59 PM EDT      Modified by: SAvie ArenasP-MD on 07/15/2020 01:10 PM EDT      Modified by: SAvie ArenasP-MD on 07/15/2020 01:11 PM EDT      Modified by: SAvie ArenasP-MD on 07/15/2020 01:12 PM EDT      Modified by: SAvie ArenasP-MD on 07/15/2020 01:29  PM EDT      Modified by: Avie Arenas P-MD on 07/15/2020 01:39 PM EDT      Modified by: Avie Arenas P-MD on 07/15/2020 02:30 PM EDT      Modified by: Avie Arenas P-MD on 07/15/2020 02:47 PM EDT      Modified by: Avie Arenas P-MD on 07/15/2020 03:15 PM EDT      Modified by: Avie Arenas P-MD on 07/15/2020 03:16 PM EDT

## 2020-07-15 NOTE — ED Notes (Signed)
ED Note-Nursing       ED Abuse Assessment 2021 Entered On:  07/15/2020 13:08 EDT    Performed On:  07/15/2020 13:08 EDT by Cynda Acres, RN, Rosalita Chessman               Abuse Assessment   Right click within any box for Suspected Abuse policy link. :   No   Right click within any box for Suspected Abuse policy link. :   No   Right click within any box for Suspected Abuse policy link. :   No   Crapse, RN, Rosalita Chessman - 07/15/2020 13:08 EDT   Right click within any box for Suspected Abuse policy link. :   Document assessment   Lorel Monaco - 07/15/2020 13:09 EDT     Right click within any box for Suspected Abuse policy link. :   No   Crapse, RN, Rosalita Chessman - 07/15/2020 13:08 EDT   Neglect/Abandoment Assessment   Neglect/Abandoment Assessment :   Dehydrated, Inadequate medical care, Untreated/unmanaged health problems   Neglect/Abandoment Reported by :   Jorene Minors - 07/15/2020 13:09 EDT   Abuse Reporting   All reportable abuse - complete task for required documentation.  Right click within Abuse Reportable box for Suspected Abuse Policy link. :   No   Lorel Monaco - 07/15/2020 13:09 EDT

## 2020-07-15 NOTE — ED Notes (Signed)
ED Note-Nursing       ED Abuse Assessment 2021 Entered On:  07/15/2020 13:08 EDT    Performed On:  07/15/2020 13:08 EDT by Lorel Monaco               Abuse Assessment   Right click within any box for Suspected Abuse policy link. :   Document assessment   Lorel Monaco - 07/15/2020 13:08 EDT   Neglect/Abandoment Assessment   Neglect/Abandoment Assessment :   Inadequate clothing, Inadequate medical care, Untreated/unmanaged health problems   Neglect/Abandoment Reported by :   Jorene Minors - 07/15/2020 13:08 EDT   Abuse Reporting   All reportable abuse - complete task for required documentation.  Right click within Abuse Reportable box for Suspected Abuse Policy link. :   No   Lorel Monaco - 07/15/2020 13:08 EDT

## 2020-07-15 NOTE — ED Notes (Signed)
ED Patient Education Note     ;Patient Education Materials Follows:               Alcohol and Drug Clinic Referrals       [place] [facility]   [phone]  [email or web address]     Alcoholics Anonymous [place] ([employer] Hotline)  Ph- [phone]     Co-dependents, and Adult Children of Alcoholics  Ph- [phone]     [facility]  [address], [place] [address]   [phone]     [facility]  [address] [place] [address]  [phone]  Inpatient and Outpatient Services for Drug and Alcohol Detox, Methadone Clinic and Detox (prescriptions for methadone are not given). Insured and uninsured.      [facility] Alcohol and Drug Abuse Hotline  Ph- [phone]     [facility] Commission on Alcohol and Drugs  [address], [place] [address]   ph- [phone] Narcotics Anonymous Hotline   [phone]     [facility] [place] [facility] [place]  [address], [place] [address]   [phone], (referral coordinator) [phone]  Call for appointments.     [place] Department of Health and Human Services: Substance Abuse and Mental Health Services Administration  [employer].pt     National Suicide Prevention Lifeline  1-800-273-TALK 414-300-4224)  www.suicidepreventionlifeline.org     Drug Addiction Assistance Helpline  251-078-5906      Mental and Behavioral Health     Opioid Overdose    Opioids are drugs that are often used to treat pain. Opioids include illegal drugs, such as heroin, as well as prescription pain medicines, such as codeine, morphine, hydrocodone, oxycodone, and fentanyl. An opioid overdose happens when you take too much of an opioid. An overdose may be intentional or accidental and can happen with any type of opioid.    The effects of an overdose can be mild, dangerous, or even deadly. Opioid overdose is a medical  emergency.      What are the causes?    This condition may be caused by:   Taking too much of an opioid on purpose.     Taking too much of an opioid by accident.     Using two or more substances that contain opioids at the same time.     Taking an opioid with a substance that affects your heart, breathing, or blood pressure. These include alcohol, tranquilizers, sleeping pills, illegal drugs, and some over-the-counter medicines.      This condition may also happen due to an error made by:   A health care provider who prescribes a medicine.     The pharmacist who fills the prescription order.        What increases the risk?    This condition is more likely in:   Children. They may be attracted to colorful pills. Because of a child's small size, even a small amount of a drug can be dangerous.     Older people. They may be taking many different drugs. Older people may have difficulty reading labels or remembering when they last took their medicine. They may also be more sensitive to the effects of opioids.     People with chronic medical conditions, especially heart, liver, kidney, or neurological diseases.     People who  take an opioid for a long period of time.     People who use:  ? Illegal drugs. IV heroin is especially dangerous.    ? Other substances, including alcohol, while using an opioid.       People who have:  ? A history of drug or alcohol abuse.    ? Certain mental health conditions.    ? A history of previous drug overdoses.       People who take opioids that are not prescribed for them.        What are the signs or symptoms?    Symptoms of this condition depend on the type of opioid and the amount that was taken. Common symptoms include:   Sleepiness or difficulty waking from sleep.     Decrease in attention.     Confusion.     Slurred speech.     Slowed breathing and a slow pulse (bradycardia).     Nausea and vomiting.     Abnormally small pupils.      Signs and symptoms that require emergency  treatment include:   Cold, clammy, and pale skin.     Blue lips and fingernails.     Vomiting.     Gurgling sounds in the throat.     A pulse that is very slow or difficult to detect.     Breathing that is very irregular, slow, noisy, or difficult to detect.     Limp body.     Inability to respond to speech or be awakened from sleep (stupor).     Seizures.        How is this diagnosed?    This condition is diagnosed based on your symptoms and medical history. It is important to tell your health care provider:   About all of the opioids that you took.     When you took the opioids.     Whether you were drinking alcohol or using marijuana, cocaine, or other drugs.      Your health care provider will do a physical exam. This exam may include:   Checking and monitoring your heart rate and rhythm, breathing rate, temperature, and blood pressure (vital signs).     Measuring oxygen levels in your blood.     Checking for abnormally small pupils.      You may also have blood tests or urine tests. You may have X-rays if you are having severe breathing problems.      How is this treated?    This condition requires immediate medical treatment and hospitalization. Treatment is given in the hospital intensive care (ICU) setting. Supporting your vital signs and your breathing is the first step in treating an opioid overdose. Treatment may also include:   Giving salts and minerals (electrolytes) along with fluids through an IV.     Inserting a breathing tube (endotracheal tube) in your airway to help you breathe if you cannot breathe on your own or you are in danger of not being able to breathe on your own.     Giving oxygen through a small tube under your nose.     Passing a tube through your nose and into your stomach (nasogastric tube, or NG tube) to empty your stomach.     Giving medicines that:  ? Increase your blood pressure.    ? Relieve nausea and vomiting.    ? Relieve abdominal pain and cramping.    ? Reverse the effects  of the opioid (  naloxone).       Monitoring your heart and oxygen levels.     Ongoing counseling and mental health support if you intentionally overdosed or used an illegal drug.        Follow these instructions at home:      Medicines     Take over-the-counter and prescription medicines only as told by your health care provider.     Always ask your health care provider about possible side effects and interactions of any new medicine that you start taking.     Keep a list of all the medicines that you take, including over-the-counter medicines. Bring this list with you to all your medical visits.      General instructions     Drink enough fluid to keep your urine pale yellow.     Keep all follow-up visits as told by your health care provider. This is important.        How is this prevented?     Read the drug inserts that come with your opioid pain medicines.     Take medicines only as told by your health care provider. Do not take more medicine than you are told. Do not take medicines more frequently than you are told.     Do not drink alcohol or take sedatives when taking opioids.     Do not use illegal or recreational drugs, including cocaine, ecstasy, and marijuana.     Do not take opioid medicines that are not prescribed for you.     Store all medicines in safety containers that are out of the reach of children.     Get help if you are struggling with:  ? Alcohol or drug use.    ? Depression or another mental health problem.    ? Thoughts of hurting yourself or another person.       Keep the phone number of your local poison control center near your phone or in your mobile phone. In the [place] the hotline of the [facility] is [phone].     If you were prescribed naloxone, make sure you understand how to take it.      Contact a health care provider if you:     Need help understanding how to take your pain medicines.     Feel your medicines are too strong.     Are concerned that your pain  medicines are not working well for your pain.     Develop new symptoms or side effects when you are taking medicines.      Get help right away if:     You or someone else is having symptoms of an opioid overdose. Get help even if you are not sure.     You have serious thoughts about hurting yourself or others.     You have:  ? Chest pain.    ? Difficulty breathing.    ? A loss of consciousness.      These symptoms may represent a serious problem that is an emergency. Do not wait to see if the symptoms will go away. Get medical help right away. Call your local emergency services (911 in the [place] Do not drive yourself to the hospital.    If you ever feel like you may hurt yourself or others, or have thoughts about taking your own life, get help right away. You can go to your nearest emergency department or call:   Your local emergency services (911  in the [place]     A suicide crisis helpline, such as the National Suicide Prevention Lifeline at 713-601-3505. This is open 24 hours a day.        Summary     Opioids are drugs that are often used to treat pain. Opioids include illegal drugs, such as heroin, as well as prescription pain medicines.     An opioid overdose happens when you take too much of an opioid.     Overdoses can be intentional or accidental.     Opioid overdose is very dangerous. It is a life-threatening emergency.     If you or someone you know is experiencing an opioid overdose, get help right away.      This information is not intended to replace advice given to you by your health care provider. Make sure you discuss any questions you have with your health care provider.      Document Revised: 12/11/2017 Document Reviewed: 12/11/2017  Elsevier Patient Education ? 2021 Elsevier Inc.

## 2020-07-15 NOTE — ED Notes (Signed)
ED Pre-Arrival Note        Pre-Arrival Summary    Name:  medic 20,    Current Date:  07/15/2020 12:40:34 EDT  Gender:  Female  Date of Birth:    Age:  35  Pre-Arrival Type:  EMS  ETA:  07/15/2020 12:56:00 EDT  Primary Care Physician:    Presenting Problem:  opiod  overdose  Pre-Arrival User:  Lequita Halt, RN, Diane C  Referring Source:    Location:  [facility]            PreArrival Communication Form  Emergency Department        Additional Patient Information:        Orders:  [    ] CBC                                            [     ] CT Head no contrast  [    ] BMP                                           [     ] CT Abdomen/Pelvis no contrast  [    ] PT/INR                                       [     ] CT Abdomen/Pelvis IV contrast, w/ oral contrast  [    ] Troponin                                   [     ] CT Abdomen/Pelvis IV contrast, no oral contrast  [    ] BNP                                            [     ] See ordersheet  [    ] CXR                                             [     ] Other:__________________________  [    ] EKG

## 2020-07-15 NOTE — Discharge Summary (Signed)
 ED Clinical Summary                     [facility]  [address]  [place], [place], [address]  [phone]          PERSON INFORMATION  Name: Diana Cain, Diana Cain Age:  35 Years DOB: [birth year]   Sex: Female Language: English PCP: PCP,  NONE   Marital Status: Single Phone: [phone] Med Service: MED-Medicine   MRN: [id number] Acct# [id number] Arrival: 07/15/2020 12:39:00   Visit Reason: Opiate overdose; Opiate overdose; OD Acuity: 2 LOS: 000 02:44   Address:    [address] [place] [address]   Diagnosis:    Opioid overdose  Medications:          Medications That Were Updated - Follow Current Instructions  Printed Prescriptions  Current nalOXone (Narcan 4 mg/0.1 mL nasal spray) 4 Milligram Nasal (into the nose) every 2 minutes as needed overdose. 1 spray (4 mg) every 2-3 minutes until patient responds or EMS arrives. Refills: 0.  Last Dose:____________________    Other Medications  Current nalOXone (Narcan 4 mg/0.1 mL nasal spray) 4 Milligram Nasal (into the nose) every 2 minutes as needed overdose. 1 spray (4 mg) every 2-3 minutes until patient responds or EMS arrives. Refills: 0.  Last Dose:____________________  Current nalOXone (Narcan 4 mg/0.1 mL nasal spray) 4 Milligram Nasal (into the nose) every 2 minutes as needed overdose. 1 spray (4 mg) every 2-3 minutes until patient responds or EMS arrives. Refills: 0.  Last Dose:____________________    Medications that have not changed  Other Medications  QUEtiapine (SEROquel XR 300 mg oral tablet, extended release) 1 Tabs Oral (given by mouth) every day., SOUND ALIKE / LOOK ALIKE - VERIFY DRUG  Last Dose:____________________      Medications Administered During Visit:                Medication Dose Route   Sodium Chloride 0.9% 1000 mL IV Piggyback   naloxone 0.4 mg IV Push   naloxone 0.4 mg IV Push               Allergies      No Known Allergies      Major Tests and Procedures:  The following procedures and tests  were performed during your ED visit.  COMMON PROCEDURES%>  COMMON PROCEDURES COMMENTS%>                PROVIDER INFORMATION               Provider Role Assigned Sampson KRISTINA DITCH P-MD ED Provider 07/15/2020 12:40:03    Crapse, RN, Erminio BIRCH ED Nurse 07/15/2020 12:54:11        Attending Physician:  KRISTINA DITCH P-MD      Admit Doc  KRISTINA DITCH P-MD     Consulting Doc       VITALS INFORMATION  Vital Sign Triage Latest   Temp Oral ORAL_1%> ORAL%>   Temp Temporal TEMPORAL_1%> TEMPORAL%>   Temp Intravascular INTRAVASCULAR_1%> INTRAVASCULAR%>   Temp Axillary AXILLARY_1%>36.9 degC AXILLARY%>   Temp Rectal RECTAL_1%> RECTAL%>   02 Sat 99 % 100 %   Respiratory Rate RATE_1%> RATE%>   Peripheral Pulse Rate PULSE RATE_1%>112 bpm PULSE RATE%>   Apical Heart Rate HEART RATE_1%> HEART RATE%>   Blood Pressure BLOOD PRESSURE_1%>/ BLOOD PRESSURE_1%>76 mmHg BLOOD PRESSURE%> / BLOOD PRESSURE%>74 mmHg  Immunizations      No Immunizations Documented This Visit          DISCHARGE INFORMATION   Discharge Disposition: H Outpt-Sent Home   Discharge Location:  Home   Discharge Date and Time:  07/15/2020 15:23:23   ED Checkout Date and Time:  07/15/2020 15:23:23     DEPART REASON INCOMPLETE INFORMATION               Depart Action Incomplete Reason   Interactive View/I&O Recently assessed               Problems      No Problems Documented              Smoking Status      No Smoking Status Documented         PATIENT EDUCATION INFORMATION  Instructions:     Alcohol & Drug Clinic Referrals (Custom); Opioid Overdose     Follow up:                   With: Address: When:   Follow up with primary care provider  Within 2 to 4 days   Comments:   Please follow-up with your primary care physician. Please return to the emergency department should your symptoms persist or worsen, fever, difficulty breathing, chest pain, headache, vomiting, and/or develop any other concerns.              ED PROVIDER DOCUMENTATION     Patient:    Diana Cain             MRN: [id number]            FIN: [id number]               Age:   44 years     Sex:  Female     DOB:  [birth year]   Associated Diagnoses:   Opioid overdose   Author:   KRISTINA DITCH P-MD      Basic Information   Time seen: Provider Seen (ST)   ED Provider/Time:    KRISTINA DITCH P-MD / 07/15/2020 12:40  .   History source: Patient.   Additional information: Chief Complaint from Nursing Triage Note   Chief Complaint   No qualifying data available.SABRA      History of Present Illness   35 year old female presents to the emergency department EMS after being found in the bathroom of a [employer].  Per EMS, the patient had decreased respiratory effort.  First responders gave the patient Narcan with improvement in her respiratory effort.  Patient remains altered.  Per EMS, syringes/drug paraphenalia were found around the patient..        Review of Systems             Additional review of systems information: Unable to obtain due to: Clinical condition.      Health Status   Allergies:    Allergic Reactions (Selected)  No Known Allergies.   Medications:  (Selected)   Inpatient Medications  Ordered  Narcan injection: 0.4 mg, 1 mL, IV Push, Once  Sodium Chloride 0.9% bolus: 1,000 mL, 2000 mL/hr, IV Piggyback, Once  Prescriptions  Prescribed  Narcan 4 mg/0.1 mL nasal spray: 4 mg, Nasal, q2min, 1 spray (4 mg) every 2-3 minutes until patient responds or EMS arrives, PRN: overdose, 1 EA, 0 Refill(s)  Narcan 4 mg/0.1 mL nasal spray: 4 mg, Nasal, q2min, 1 spray (4 mg) every 2-3 minutes until patient responds or  EMS arrives, PRN: overdose, 1 EA, 0 Refill(s)  Documented Medications  Documented  SEROquel XR 300 mg oral tablet, extended release: 300 mg, 1 tabs, Oral, Daily, 30 tabs, 0 Refill(s).      Past Medical/ Family/ Social History   Surgical history: Reviewed as documented in chart.   Social history:    Social & Psychosocial Habits    No Data Available  .   Problem list:    No qualifying data available  .       Physical Examination   General:  No acute distress, Patient appears altered but becomes more responsive with painful stimuli.    Skin:  Warm, dry.    Head:  Normocephalic, atraumatic.    Neck:  Supple.   Cardiovascular:  Regular rate and rhythm, Normal peripheral perfusion.    Respiratory:  Lungs are clear to auscultation, respirations are non-labored, breath sounds are equal, Symmetrical chest wall expansion.    Gastrointestinal:  Soft, Nontender, Non distended.    Psychiatric:  Cooperative, Altered.       Medical Decision Making   Rationale:  35 year old female with likely opioid overdose.  Patient was found to have drug paraphernalia around her in the [employer] bathroom where she was found.  Patient did have some response to the Narcan.  However, patient was still altered.  We will check a CT of her head as the patient was found on the floor and to continue to be altered.  CT of the head is unremarkable.  Lab work unremarkable.  Patient did respond appropriately to the second dose of Narcan IV.  We will observe the patient in the emergency department.  Patient denies SI/HI.  SABRA   Orders  Launch Order Profile (Selected)   Inpatient Orders  Ordered  Fall Risk Precautions:   Patient Specific Fall Safety Measures:   Completed  BMP:   CBC and Auto Diff WBC Count:   CT Head or Brain w/o Contrast:   Communication to Nursing:   Differential Manual:   ED Assessment Adult:   ED Secondary Triage:   ED Triage Adult:   ETOH:   Magnesium Level:   Narcan injection: 0.4 mg, 1 mL, IV Push, Once  PCXR:   Patient Specific Fall Safety Measures:   Saline Lock Insert:   Sodium Chloride 0.9% bolus: 1,000 mL, 2000 mL/hr, IV Piggyback, Once  Troponin T:   Completed (Signed)  EKG: .   Electrocardiogram:  Emergency Provider interpretation performed by me, time 07/15/2020 12:51:00, rate 78, sinus rhythm, normal axis, no concerning ST elevations.    Results review:  Lab results : Lab View   07/15/2020 13:22 EDT Estimated Creatinine Clearance  97.01 mL/min   07/15/2020 12:57 EDT WBC 6.5 x10e3/mcL    RBC 4.05 x10e6/mcL    Hgb 10.6 g/dL  LOW    HCT 67.7 %  LOW    MCV 79.5 fL  LOW    MCH 26.2 pg  LOW    MCHC 32.9 g/dL    RDW 85.5 %    Platelet 338 x10e3/mcL    MPV 10.8 fL    Neutrophil M Pct 52 %    Lymphcyte M Pct 36 %    Monocyte M Pct 6 %    Eosinophil M Pct 4 %    Basophil M Pct 2 %    Neutrophil M # 3.4 x10e3/mcL    Lymphocyte M # 2.3 x10e3/mcL    Monocyte M # 0.4 x10e3/mcL    Eosinophil M #  0.3 x10e3/mcL    Basophil M # 0.1 x10e3/mcL    PLT estimate Adequate    RBC morphology Normal    Sodium Lvl 132 mmol/L  LOW    Potassium Lvl 4.2 mmol/L    Chloride 98 mmol/L    CO2 27 mmol/L    Glucose Random 91 mg/dL    BUN 18 mg/dL    Creatinine Lvl 0.8 mg/dL    AGAP 7 mmol/L    Osmolality Calc 266 mOsm/kg  LOW    Calcium Lvl 8.7 mg/dL    eGFR 899 fO/fpw/8.26f    Magnesium Lvl 2.4 mg/dL    Trop T Quant <9.989 ng/mL    Ethanol Lvl None Detected mg/dL   2/0/7977 87:56 EDT Estimated Creatinine Clearance 129.34 mL/min   .   Radiology results:  Rad Results (ST)   CT Head or Brain w/o Contrast  ?  07/15/20 13:32:01  CT Head without contrast. 07/15/20:    COMPARISON: 02/25/2018    INDICATION: Delirium, overdose    TECHNIQUE: 4.8 mm axial images from the convexity to the skull base without  contrast. CT scanning was performed using radiation dose reduction techniques  when appropriate, per system protocols    FINDINGS: No mass or shift of midline structure. Elnor and white matter  structures are unremarkable. The ventricular system and CSF pathways are normal.  The calvarium is intact. Limited sinuses and mastoids are clear.    IMPRESSION:  1. No mass, acute infarct, or hemorrhage  ?  Signed By: ALEXANDRIA RUSH CHRISMAN-MD  ?  **************************************************  XR Chest 1 View Portable  ?  07/15/20 13:28:54  Portable AP chest: 07/15/20    Comparison: None    Indication: Other abnormalities of breathing    FINDINGS: No frank consolidation. No pneumothorax.  Cardiomediastinal contour is  unremarkable.    IMPRESSION:  No evidence for acute cardiopulmonary process.    If you have questions regarding this report, please feel free to call me  directly using [employer].  ?  Signed By: BLAIR REBEL  .      Reexamination/ Reevaluation   Time: 07/15/2020 13:09:00 .   Notes: patient is more awake, alert, and appropriate after the narcan in the ED. patient with no midline c-spine ttp. patient with no midline back ttp. patient talking on her cell phone. patient denies SOB. no CP. no HA. patient denies SI/HI. no fever. no V/D. SABRA   Time: 07/15/2020 14:29:00 .   Notes: patient more somnolent. patient awakes with verbal stimuli but goes back to sleep. will give another dose of narcan..   Time: 07/15/2020 14:46:00 .   Notes: patient more awake and alert after second dose of narcan..   Time: 07/15/2020 15:13:00 .   Notes: Patient is awake, alert, and appropriate.  Patient is demanding to leave.  Her boyfriend is in the room and notes that he will drive the patient home.  He agrees to watch the patient closely for the next several hours.  He is requesting a prescription for Narcan.  Patient expressed understanding of the possible risks of death, permanent disability, stroke, heart attack from leaving the emergency department prematurely.  I did advise to observe the patient for another hour or 2.  Patient refuses and demands to leave..      Impression and Plan   Diagnosis   Opioid overdose (ICD10-CM T40.2X1A, Discharge, Medical)   Plan   Condition: Stable.    Disposition: Discharged: Time  07/15/2020 15:16:00, to home.    Prescriptions:  Launch prescriptions   Pharmacy:  Narcan 4 mg/0.1 mL nasal spray (Prescribe): 4 mg, Nasal, q2min, 1 spray (4 mg) every 2-3 minutes until patient responds or EMS arrives, PRN: overdose, 1 EA, 0 Refill(s).    Patient was given the following educational materials: Opioid Overdose, Alcohol & Drug Clinic Referrals (Custom).    Follow up with: Follow up with primary  care provider Within 2 to 4 days Please follow-up with your primary care physician.  Please return to the emergency department should your symptoms persist or worsen, fever, difficulty breathing, chest pain, headache, vomiting, and/or develop any other concerns..    Counseled: Patient, Regarding diagnostic results, Regarding treatment plan, Patient indicated understanding of instructions.

## 2020-07-15 NOTE — ED Notes (Signed)
ED Triage Note       ED Secondary Triage Entered On:  07/15/2020 13:09 EDT    Performed On:  07/15/2020 13:08 EDT by Cynda Acres, RN, Rosalita Chessman               General Information   Barriers to Learning :   None evident   ED Home Meds Section :   Document assessment   [facility] ED Fall Risk Section :   Document assessment   ED Advance Directives Section :   Document assessment   ED Palliative Screen :   N/A (prefilled for <34yo)   Cynda Acres RN, Steward Drone D - 07/15/2020 13:08 EDT   (As Of: 07/15/2020 13:09:19 EDT)   Diagnoses(Active)    Opiate overdose  Date:   07/15/2020 ; Diagnosis Type:   Reason For Visit ; Confirmation:   Confirmed ; Clinical Dx:   Opiate overdose ; Classification:   Medical ; Clinical Service:   Emergency medicine ; Code:   PNED ; Probability:   0 ; Diagnosis Code:   [id number]      Opiate overdose  Date:   07/15/2020 ; Diagnosis Type:   Reason For Visit ; Confirmation:   Complaint of ; Clinical Dx:   Opiate overdose ; Classification:   Medical ; Clinical Service:   Emergency medicine ; Code:   PNED ; Probability:   0 ; Diagnosis Code:   [id number]      Opioid overdose  Date:   07/15/2020 ; Diagnosis Type:   Discharge ; Confirmation:   Confirmed ; Clinical Dx:   Opioid overdose ; Classification:   Medical ; Clinical Service:   Non-Specified ; Code:   ICD-10-CM ; Probability:   0 ; Diagnosis Code:   T40.2X1A             -    Procedure History   (As Of: 07/15/2020 13:09:19 EDT)     Phoebe Perch Fall Risk Assessment Tool   Hx of falling last 3 months ED Fall :   No   Patient confused or disoriented ED Fall :   No   Patient intoxicated or sedated ED Fall :   Yes   Patient impaired gait ED Fall :   Yes   Use a mobility assistance device ED Fall :   No   Patient altered elimination ED Fall :   No   [facility] ED Fall Score :   4    Crapse, RN, Rosalita Chessman - 07/15/2020 13:08 EDT   ED Advance Directive   Advance Directive :   No   Crapse, RN, Rosalita Chessman - 07/15/2020 13:08 EDT   Med Hx    Medication List   (As Of: 07/15/2020 13:09:19 EDT)   Normal Order    naloxone 0.4 mg/mL Inj Soln 1 mL  :   naloxone 0.4 mg/mL Inj Soln 1 mL ; Status:   Completed ; Ordered As Mnemonic:   Narcan injection ; Simple Display Line:   0.4 mg, 1 mL, IV Push, Once ; Ordering Provider:   Clovis Fredrickson P-MD; Catalog Code:   nalOXone ; Order Dt/Tm:   07/15/2020 12:42:21 EDT          Sodium Chloride 0.9% intravenous solution Bolus  :   Sodium Chloride 0.9% intravenous solution Bolus ; Status:   Completed ; Ordered As Mnemonic:   Sodium Chloride 0.9% bolus ; Simple Display Line:   1,000 mL, 2000 mL/hr, IV Piggyback, Once ; Ordering Provider:   Juliene Pina,  JEREMY P-MD; Catalog Code:   Sodium Chloride 0.9% ; Order Dt/Tm:   07/15/2020 12:42:00 EDT            Prescription/Discharge Order    nalOXone  :   nalOXone ; Status:   Prescribed ; Ordered As Mnemonic:   Narcan 4 mg/0.1 mL nasal spray ; Simple Display Line:   4 mg, Nasal, q77min, 1 spray (4 mg) every 2-3 minutes until patient responds or EMS arrives, PRN: overdose, 1 EA, 0 Refill(s) ; Ordering Provider:   RUPP,  REBECCA ASHLEY-DO; Catalog Code:   nalOXone ; Order Dt/Tm:   02/26/2020 20:59:52 EST          nalOXone  :   nalOXone ; Status:   Prescribed ; Ordered As Mnemonic:   Narcan 4 mg/0.1 mL nasal spray ; Simple Display Line:   4 mg, Nasal, q45min, 1 spray (4 mg) every 2-3 minutes until patient responds or EMS arrives, PRN: overdose, 1 EA, 0 Refill(s) ; Ordering Provider:   RUPP,  REBECCA ASHLEY-DO; Catalog Code:   nalOXone ; Order Dt/Tm:   01/31/2020 20:50:41 EST            Home Meds    QUEtiapine  :   QUEtiapine ; Status:   Documented ; Ordered As Mnemonic:   SEROquel XR 300 mg oral tablet, extended release ; Simple Display Line:   300 mg, 1 tabs, Oral, Daily, 30 tabs, 0 Refill(s) ; Catalog Code:   QUEtiapine ; Order Dt/Tm:   01/31/2020 14:43:13 EST ; Comment:   SOUND ALIKE / LOOK ALIKE - VERIFY DRUG

## 2020-07-15 NOTE — ED Notes (Signed)
ED Triage Note       ED Triage Adult Entered On:  07/15/2020 12:43 EDT    Performed On:  07/15/2020 12:40 EDT by Lorel Monaco               Triage   Numeric Rating Pain Scale :   0 = No pain   Chief Complaint :   FOUND UNRESPONSIVE IN A PUBLIC BATHROOM. RECEIVED 0.5MG  OF NARCAN IN WITH MINIMAL RESULTS. PT AROUSABLE UPON ARRIVAL.    [place] Mode of Arrival :   Ambulance   Infectious Disease Documentation :   Unable to obtain   Heart Rate Monitored :   87 bpm   Respiratory Rate :   10 br/min (LOW)    Systolic Blood Pressure :   116 mmHg   Diastolic Blood Pressure :   76 mmHg   SpO2 :   99 %   Oxygen Therapy :   Room air   Patient presentation :   None of the above   Chief Complaint or Presentation suggest infection :   No   Weight Dosing :   70.5 kg(Converted to: 155 lb 7 oz)    Height :   170 cm(Converted to: 5 ft 7 in)    Body Mass Index Dosing :   24 kg/m2   Lorel Monaco - 07/15/2020 12:40 EDT   DCP GENERIC CODE   Tracking Acuity :   2   Tracking Group :   ED [employer] Tracking Group   Lorel Monaco - 07/15/2020 12:40 EDT   ED General Section :   Document assessment   Pregnancy Status :   Patient denies   ED Allergies Section :   Document assessment   ED Reason for Visit Section :   Document assessment   Lorel Monaco - 07/15/2020 12:40 EDT   PTA/Triage Treatments   ED PTA MEDICATIONS :   Narcan   Lorel Monaco - 07/15/2020 12:40 EDT   Allergies   (As Of: 07/15/2020 12:43:40 EDT)   Allergies (Active)   No Known Allergies  Estimated Onset Date:   Unspecified ; Created ByTresa Endo, RN, Rex Kras; Reaction Status:   Active ; Category:   Drug ; Substance:   No Known Allergies ; Type:   Allergy ; Updated By:   Tresa Endo, RN, Rex Kras; Reviewed Date:   07/15/2020 12:43 EDT        Psycho-Social   Last 3 mo, thoughts killing self/others :   NA - Pediatric pt OR unable to answer   Right click within box for Suspected Abuse policy link. :   Neglect-including self-neglect   Feels Safe Where Live :   Unable to obtain   ED  Behavioral Activity Rating Scale :   1 - Difficult or unable to arouse   Lorel Monaco - 07/15/2020 12:40 EDT   ED Reason for Visit   (As Of: 07/15/2020 12:43:40 EDT)   Diagnoses(Active)    Opiate overdose  Date:   07/15/2020 ; Diagnosis Type:   Reason For Visit ; Confirmation:   Confirmed ; Clinical Dx:   Opiate overdose ; Classification:   Medical ; Clinical Service:   Emergency medicine ; Code:   PNED ; Probability:   0 ; Diagnosis Code:   [id number]      Opiate overdose  Date:   07/15/2020 ; Diagnosis Type:   Reason For Visit ; Confirmation:   Complaint of ; Clinical Dx:  Opiate overdose ; Classification:   Medical ; Clinical Service:   Emergency medicine ; Code:   PNED ; Probability:   0 ; Diagnosis Code:   [id number]

## 2020-07-15 NOTE — ED Notes (Signed)
 ED Patient Summary       ;       [facility]  [address] [place], [place] [address]  [phone]  Discharge Instructions (Patient)  Name: Diana Cain, Diana Cain  DOB: 1985/02/15                   MRN: [id number]                   FIN: NBR%[phone]  Reason For Visit: Opiate overdose; Opiate overdose; OD  Final Diagnosis: Opioid overdose     Visit Date: 07/15/2020 12:39:00  Address: [address] [place] [address]  Phone: [phone]     Emergency Department Providers:        Primary Physician:      KRISTINA VENETIA SHAUNNA [facility] would like to thank you for allowing us  to assist you with your healthcare needs. The following includes patient education materials and information regarding your injury/illness.     Follow-up Instructions:  You were seen today on an emergency basis. Please contact your primary care doctor for a follow up appointment. If you received a referral to a specialist doctor, it is important you follow-up as instructed.    It is important that you call your follow-up doctor to schedule and confirm the location of your next appointment. Your doctor may practice at multiple locations. The office location of your follow-up appointment may be different to the one written on your discharge instructions.    If you do not have a primary care doctor, please call [phone] for help in finding a [facility] Provider. For help in finding a specialist doctor, please call [phone].    If your condition gets worse before your follow-up with your primary care doctor or specialist, please return to the Emergency Department.      Coronavirus 2019 (COVID-19) Reminders:     Patients age 14 - 40, with parental consent, and patients over age 72 can make an appointment for a COVID-19 vaccine. Patients can contact their [facility] doctors' offices to schedule an appointment to receive the COVID-19 vaccine.  Patients who do not have a [facility] physician can call [phone] to schedule vaccination appointments.      Follow Up Appointments:  Primary Care Provider:     Name: PCP,  NONE     Phone:                  With: Address: When:   Follow up with primary care provider  Within 2 to 4 days   Comments:   Please follow-up with your primary care physician. Please return to the emergency department should your symptoms persist or worsen, fever, difficulty breathing, chest pain, headache, vomiting, and/or develop any other concerns.              Post [facility] SERVICES%>          Medications That Were Updated - Follow Current Instructions  Printed Prescriptions  Current nalOXone (Narcan 4 mg/0.1 mL nasal spray) 4 Milligram Nasal (into the nose) every 2 minutes as needed overdose. 1 spray (4 mg) every 2-3 minutes until patient responds or EMS arrives. Refills: 0.  Last Dose:____________________    Other Medications  Current nalOXone (Narcan 4 mg/0.1 mL nasal spray) 4 Milligram Nasal (into the nose) every 2 minutes as needed overdose. 1  spray (4 mg) every 2-3 minutes until patient responds or EMS arrives. Refills: 0.  Last Dose:____________________  Current nalOXone (Narcan 4 mg/0.1 mL nasal spray) 4 Milligram Nasal (into the nose) every 2 minutes as needed overdose. 1 spray (4 mg) every 2-3 minutes until patient responds or EMS arrives. Refills: 0.  Last Dose:____________________    Medications that have not changed  Other Medications  QUEtiapine (SEROquel XR 300 mg oral tablet, extended release) 1 Tabs Oral (given by mouth) every day., SOUND ALIKE / LOOK ALIKE - VERIFY DRUG  Last Dose:____________________      Allergy Info: No Known Allergies     Discharge Additional Information          Discharge Patient 07/15/20 15:16:00 EDT      Patient Education Materials:                  Alcohol and Drug Clinic Referrals       [facility]   [phone]  www.[facility].com     Alcoholics Anonymous [place] ([employer] Hotline)  Ph- 561-190-9843     Co-dependents, and Adult Children of Alcoholics  Ph- 352-800-3158     [facility]  [address] [place] [address]   [phone]     [facility]  [address] [place] [address]  [phone]  Inpatient and Outpatient Services for Drug and Alcohol Detox, Methadone Clinic and Detox (prescriptions for methadone are not given). Insured and uninsured.      [facility] Alcohol and Drug Abuse Hotline  Ph- [phone])[phone] [phone]) [phone]     [facility] Commission on Alcohol and Drugs  [address], [place] [address]   ph- 9151829800 Narcotics Anonymous Hotline   6821241243     [facility], [facility]   [address] [place] [address]   [phone], (referral coordinator) [phone]  Call for appointments.     [place]  Department of Health and Human Services: Substance Abuse and Mental Health Services Administration  [employer].pt     National Suicide Prevention Lifeline  1-800-273-TALK 530-035-0672)  www.suicidepreventionlifeline.org     Drug Addiction Assistance Helpline  (334) 801-3170       Opioid Overdose    Opioids are drugs that are often used to treat pain. Opioids include illegal drugs, such as heroin, as well as prescription pain medicines, such as codeine, morphine, hydrocodone, oxycodone, and fentanyl. An opioid overdose happens when you take too much of an opioid. An overdose may be intentional or accidental and can happen with any type of opioid.    The effects of an overdose can be mild, dangerous, or even deadly. Opioid overdose is a medical emergency.      What are the causes?    This condition may be caused by:   Taking too much of an opioid on purpose.     Taking too much of an opioid by accident.     Using two or more substances that contain opioids at  the same time.     Taking an opioid with a substance that affects your heart, breathing, or blood pressure. These include alcohol, tranquilizers, sleeping pills, illegal drugs, and some over-the-counter medicines.      This condition may also happen due to an error made by:   A health care provider who prescribes  a medicine.     The pharmacist who fills the prescription order.        What increases the risk?    This condition is more likely in:   Children. They may be attracted to colorful pills. Because of a child's small size, even a small amount of a drug can be dangerous.     Older people. They may be taking many different drugs. Older people may have difficulty reading labels or remembering when they last took their medicine. They may also be more sensitive to the effects of opioids.     People with chronic medical conditions, especially heart, liver, kidney, or neurological diseases.     People who take an opioid for a long period of time.     People who use:  ? Illegal drugs. IV heroin is especially dangerous.    ? Other substances, including alcohol, while using an opioid.       People who have:  ? A history of drug or alcohol abuse.    ? Certain mental health conditions.    ? A history of previous drug overdoses.       People who take opioids that are not prescribed for them.        What are the signs or symptoms?    Symptoms of this condition depend on the type of opioid and the amount that was taken. Common symptoms include:   Sleepiness or difficulty waking from sleep.     Decrease in attention.     Confusion.     Slurred speech.     Slowed breathing and a slow pulse (bradycardia).     Nausea and vomiting.     Abnormally small pupils.      Signs and symptoms that require emergency treatment include:   Cold, clammy, and pale skin.     Blue lips and fingernails.     Vomiting.     Gurgling sounds in the throat.     A pulse that is very slow or difficult to detect.     Breathing that is very irregular, slow,  noisy, or difficult to detect.     Limp body.     Inability to respond to speech or be awakened from sleep (stupor).     Seizures.        How is this diagnosed?    This condition is diagnosed based on your symptoms and medical history. It is important to tell your health care provider:   About all of the opioids that you took.     When you took the opioids.     Whether you were drinking alcohol or using marijuana, cocaine, or other drugs.      Your health care provider will do a physical exam. This exam may include:   Checking and monitoring your heart rate and rhythm, breathing rate, temperature, and blood pressure (vital signs).     Measuring oxygen levels in your blood.     Checking for abnormally small pupils.      You may also have blood tests or urine tests. You may have X-rays if you are having severe breathing problems.      How is this treated?    This condition requires immediate medical treatment and hospitalization. Treatment is given in the hospital intensive care (ICU) setting. Supporting your vital signs and your breathing is the first step in treating an opioid overdose. Treatment may also include:   Giving salts and minerals (electrolytes) along  with fluids through an IV.     Inserting a breathing tube (endotracheal tube) in your airway to help you breathe if you cannot breathe on your own or you are in danger of not being able to breathe on your own.     Giving oxygen through a small tube under your nose.     Passing a tube through your nose and into your stomach (nasogastric tube, or NG tube) to empty your stomach.     Giving medicines that:  ? Increase your blood pressure.    ? Relieve nausea and vomiting.    ? Relieve abdominal pain and cramping.    ? Reverse the effects of the opioid (naloxone).       Monitoring your heart and oxygen levels.     Ongoing counseling and mental health support if you intentionally overdosed or used an illegal drug.        Follow these instructions at home:       Medicines     Take over-the-counter and prescription medicines only as told by your health care provider.     Always ask your health care provider about possible side effects and interactions of any new medicine that you start taking.     Keep a list of all the medicines that you take, including over-the-counter medicines. Bring this list with you to all your medical visits.      General instructions     Drink enough fluid to keep your urine pale yellow.     Keep all follow-up visits as told by your health care provider. This is important.        How is this prevented?     Read the drug inserts that come with your opioid pain medicines.     Take medicines only as told by your health care provider. Do not take more medicine than you are told. Do not take medicines more frequently than you are told.     Do not drink alcohol or take sedatives when taking opioids.     Do not use illegal or recreational drugs, including cocaine, ecstasy, and marijuana.     Do not take opioid medicines that are not prescribed for you.     Store all medicines in safety containers that are out of the reach of children.     Get help if you are struggling with:  ? Alcohol or drug use.    ? Depression or another mental health problem.    ? Thoughts of hurting yourself or another person.       Keep the phone number of your local poison control center near your phone or in your mobile phone. In the [place] the hotline of the [facility] is [phone].     If you were prescribed naloxone, make sure you understand how to take it.      Contact a health care provider if you:     Need help understanding how to take your pain medicines.     Feel your medicines are too strong.     Are concerned that your pain medicines are not working well for your pain.     Develop new symptoms or side effects when you are taking medicines.      Get help right away if:     You or someone else is having symptoms of an opioid overdose. Get help  even if you are not sure.     You have serious thoughts  about hurting yourself or others.     You have:  ? Chest pain.    ? Difficulty breathing.    ? A loss of consciousness.      These symptoms may represent a serious problem that is an emergency. Do not wait to see if the symptoms will go away. Get medical help right away. Call your local emergency services (911 in the [place] Do not drive yourself to the hospital.    If you ever feel like you may hurt yourself or others, or have thoughts about taking your own life, get help right away. You can go to your nearest emergency department or call:   Your local emergency services (911 in the [place]     A suicide crisis helpline, such as the National Suicide Prevention Lifeline at 417-796-2990. This is open 24 hours a day.        Summary     Opioids are drugs that are often used to treat pain. Opioids include illegal drugs, such as heroin, as well as prescription pain medicines.     An opioid overdose happens when you take too much of an opioid.     Overdoses can be intentional or accidental.     Opioid overdose is very dangerous. It is a life-threatening emergency.     If you or someone you know is experiencing an opioid overdose, get help right away.      This information is not intended to replace advice given to you by your health care provider. Make sure you discuss any questions you have with your health care provider.      Document Revised: 12/11/2017 Document Reviewed: 12/11/2017  Elsevier Patient Education ? 2021 Elsevier Inc.      ---------------------------------------------------------------------------------------------------------------------  [facility] allows patients to review your COVID and other test results as well as discharge documents from any [facility], Emergency Department, surgical center or outpatient lab. Test results are typically available 36 hours after the test is completed.     [facility] encourages you to self-enroll in the [facility] Patient Portal.     To begin your self-enrollment process, please visit [email or web address] Under [facility], click on "Sign up now".     NOTE: You must be 16 years and older to use [facility] Self-Enroll online. If you are a parent, caregiver, or guardian; you need an invite to access your child's or dependent's health records. To obtain an invite, contact the Medical Records department at [phone] Monday through Friday, 8-4:30, select option 3 . If we receive your call afterhours, we will return your call the next business day.     If you have issues trying to create or access your account, contact [employer] support at [phone] available 7 days a week 24 hours a day.     Comment:

## 2023-04-04 ENCOUNTER — Encounter (HOSPITAL_COMMUNITY): Payer: Self-pay

## 2023-04-04 ENCOUNTER — Emergency Department (HOSPITAL_COMMUNITY): Admission: EM | Admit: 2023-04-04 | Discharge: 2023-04-04 | Disposition: A | Discharge status: Home / Self Care

## 2023-04-04 DIAGNOSIS — R4689 Other symptoms and signs involving appearance and behavior: Secondary | ICD-10-CM | POA: Insufficient documentation

## 2023-04-04 DIAGNOSIS — T507X1A Poisoning by analeptics and opioid receptor antagonists, accidental (unintentional), initial encounter: Secondary | ICD-10-CM | POA: Insufficient documentation

## 2023-04-04 DIAGNOSIS — T50901A Poisoning by unspecified drugs, medicaments and biological substances, accidental (unintentional), initial encounter: Secondary | ICD-10-CM

## 2023-04-04 LAB — SALICYLATE LEVEL: Salicylate Lvl: <7 mg/dL — ABNORMAL LOW (ref 7.0–30.0)

## 2023-04-04 LAB — COMPREHENSIVE METABOLIC PANEL WITH GFR
ALT: 18 U/L (ref 0–44)
AST: 31 U/L (ref 15–41)
Albumin: 3.7 g/dL (ref 3.5–5.0)
Alkaline Phosphatase: 48 U/L (ref 38–126)
Anion gap: 14 (ref 5–15)
BUN: 15 mg/dL (ref 6–20)
CO2: 21 mmol/L — ABNORMAL LOW (ref 22–32)
Calcium: 9.1 mg/dL (ref 8.9–10.3)
Chloride: 103 mmol/L (ref 98–111)
Creatinine, Ser: 1.18 mg/dL — ABNORMAL HIGH (ref 0.44–1.00)
GFR, Estimated: >60 mL/min (ref >60)
Glucose, Bld: 178 mg/dL — ABNORMAL HIGH (ref 70–99)
Potassium: 3.7 mmol/L (ref 3.5–5.1)
Sodium: 138 mmol/L (ref 135–145)
Total Bilirubin: 0.5 mg/dL (ref 0.0–1.2)
Total Protein: 7.4 g/dL (ref 6.5–8.1)

## 2023-04-04 LAB — HCG, SERUM, QUALITATIVE: Preg, Serum: NEGATIVE

## 2023-04-04 LAB — CBC
HCT: 37.2 % (ref 36.0–46.0)
Hemoglobin: 12.2 g/dL (ref 12.0–15.0)
MCH: 28.4 pg (ref 26.0–34.0)
MCHC: 32.8 g/dL (ref 30.0–36.0)
MCV: 86.7 fL (ref 80.0–100.0)
Platelets: 415 10*3/uL — ABNORMAL HIGH (ref 150–400)
RBC: 4.29 MIL/uL (ref 3.87–5.11)
RDW: 13.8 % (ref 11.5–15.5)
WBC: 17.1 10*3/uL — ABNORMAL HIGH (ref 4.0–10.5)
nRBC: 0 % (ref 0.0–0.2)

## 2023-04-04 LAB — ETHANOL: Alcohol, Ethyl (B): <10 mg/dL (ref <10)

## 2023-04-04 LAB — CK: Total CK: 226 U/L (ref 38–234)

## 2023-04-04 LAB — LIPASE, BLOOD: Lipase: 32 U/L (ref 11–51)

## 2023-04-04 LAB — CBG MONITORING, ED: Glucose-Capillary: 133 mg/dL — ABNORMAL HIGH (ref 70–99)

## 2023-04-04 LAB — ACETAMINOPHEN LEVEL: Acetaminophen (Tylenol), Serum: <10 ug/mL — ABNORMAL LOW (ref 10–30)

## 2023-04-04 MED ORDER — NALOXONE HCL 4 MG/0.1ML NA LIQD: NASAL | 0 refills | Status: DC

## 2023-04-04 MED ORDER — LACTATED RINGERS IV BOLUS
1000.0000 mL | Freq: Once | INTRAVENOUS | Status: AC
  Administered 2023-04-04: 1000 mL via INTRAVENOUS

## 2023-04-04 NOTE — ED Notes (Signed)
 Pt A/Ox2, confused to time/situation, unable to tell this RN what she used prior to being in the bathroom at her home.

## 2023-04-04 NOTE — ED Notes (Signed)
 Pt was placed in trendelenburg d/t hypotension.

## 2023-04-04 NOTE — ED Provider Notes (Signed)
 [place] EMERGENCY DEPARTMENT AT [facility] Provider Note   CSN: [id number] Arrival date & time: 04/04/23  4098     History  Chief Complaint  Patient presents with   Overdose    Cheree Fowles is a 38 y.o. female.  This is a 38 year old female with history of polysubstance abuse presenting to the emergency department for suspected overdose.  Was found in the bathroom by mother who administered a total of 8 mg of Narcan with improvement of mentation.  Combative with EMS was given 5 mg of IM Versed.  She is alert, able to tell me her name, but unable to provide further history at this time.  Slightly agitated        Home Medications Prior to Admission medications   Medication Sig Start Date End Date Taking? Authorizing Provider  acetaminophen (TYLENOL) 500 MG tablet Take 500 mg by mouth every 6 (six) hours as needed for headache.     [provider]  albuterol (PROVENTIL HFA;VENTOLIN HFA) 108 (90 BASE) MCG/ACT inhaler Inhale 2 puffs into the lungs every 6 (six) hours as needed for wheezing or shortness of breath.    [provider]  cloNIDine (CATAPRES) 0.1 MG tablet Take 0.1 mg by mouth 2 (two) times daily.    [provider]  ibuprofen (ADVIL,MOTRIN) 600 MG tablet Take 600 mg by mouth every 6 (six) hours as needed.    [provider]  medroxyPROGESTERone (DEPO-PROVERA) 150 MG/ML injection Inject 150 mg into the muscle every 3 (three) months.    [provider]  ondansetron (ZOFRAN-ODT) 4 MG disintegrating tablet Take 4 mg by mouth every 8 (eight) hours as needed for nausea or vomiting.    [provider]  QUEtiapine (SEROQUEL XR) 300 MG 24 hr tablet Take 1 tablet (300 mg total) by mouth at bedtime. 03/12/14   Graylon Good, PA-C      Allergies    Patient has no known allergies.    Review of Systems   Review of Systems  Physical Exam Updated Vital Signs BP 127/83   Pulse 91   Temp 97.8 F (36.6 C) (Oral)    Resp 20   SpO2 96%  Physical Exam Vitals and nursing note reviewed.  Constitutional:      Comments: Defecated on self  HENT:     Head: Normocephalic and atraumatic.     Nose: Nose normal.     Mouth/Throat:     Mouth: Mucous membranes are dry.  Eyes:     Comments: Dilated pupils.  Reactive to light.  Cardiovascular:     Rate and Rhythm: Normal rate and regular rhythm.     Pulses: Normal pulses.  Pulmonary:     Effort: Pulmonary effort is normal.     Breath sounds: Normal breath sounds.  Abdominal:     General: Abdomen is flat. There is no distension.     Tenderness: There is no abdominal tenderness. There is no guarding or rebound.  Musculoskeletal:     Cervical back: Normal range of motion. No rigidity.     Comments: Abrasions to the anterior shins  Skin:    General: Skin is warm and dry.     Capillary Refill: Capillary refill takes less than 2 seconds.  Neurological:     Mental Status: She is alert.     Comments: Exam limited by patient's participation.  She is moving all extremities in a coordinated fashion; no apparent facial droop or gross motor deficits.  Psychiatric:  Comments: Agitated     ED Results / Procedures / Treatments   Labs (all labs ordered are listed, but only abnormal results are displayed) Labs Reviewed  CBC - Abnormal; Notable for the following components:      Result Value   WBC 17.1 (*)    Platelets 415 (*)    All other components within normal limits  COMPREHENSIVE METABOLIC PANEL WITH GFR - Abnormal; Notable for the following components:   CO2 21 (*)    Glucose, Bld 178 (*)    Creatinine, Ser 1.18 (*)    All other components within normal limits  ACETAMINOPHEN LEVEL - Abnormal; Notable for the following components:   Acetaminophen (Tylenol), Serum <10 (*)    All other components within normal limits  SALICYLATE LEVEL - Abnormal; Notable for the following components:   Salicylate Lvl <7.0 (*)    All other components within normal  limits  CBG MONITORING, ED - Abnormal; Notable for the following components:   Glucose-Capillary 133 (*)    All other components within normal limits  LIPASE, BLOOD  ETHANOL  HCG, SERUM, QUALITATIVE  CK    EKG None  Radiology No results found.  Procedures Procedures    Medications Ordered in ED Medications  lactated ringers bolus 1,000 mL (1,000 mLs Intravenous New Bag/Given 04/04/23 0830)    ED Course/ Medical Decision Making/ A&P Clinical Course as of 04/04/23 1051  Fri Apr 04, 2023  0859 Patient calm and resting currently. [TY]  [id number] Workup with leukocytosis which I suspect is reactionary to her overdose/agitation.  Comprehensive panel with no significant electrolyte abnormalities.  Minor elevation in creatinine, no transaminitis to suggest hepatobiliary disease/hepatic encephalopathy.  Talk screen negative Tylenol salicylate and alcohol levels undetectable.  CK within normal range.  Lipase not elevated.  Pancreatitis unlikely.  Glucose within normal limits, DKA unlikely. [TY]  [id number] Patient reevaluated; she is alert and oriented x 3.  Reported injecting fentanyl.  Denies any other drug use.  Denies that this was a suicidal attempt.  Reports this was a slip up and that she has been sober for some time.  Family at bedside reports that they are looking into rehab facilities for her. [TY]  1049 Patient mentation continues to improve.  Maintaining oxygen saturation.  Family members at bedside feel comfortable taking patient home.  Return precautions given. [TY]    Clinical Course User Index [TY] Coral Spikes, DO                                 Medical Decision Making This is a 38 year old female history of polysubstance abuse, hypertension, asthma presented to the emergency department after being found unresponsive by family who administered Narcan with improvement of mentation.  Agitated with EMS who reported giving 1 dose of 5 mg IM Versed.  Reported stable vitals,  tachycardia.  Per chart review does have a history of polysubstance abuse; has not interacted with our healthcare system since 2020 had bacteremia at that time.  She seemingly does not have any gross localizing deficits.  She is alert, able to tell me her name.  Tachycardic, but otherwise reassuring vitals.  Maintaining oxygen saturation. Will get broad labs. EKG. Give IV fluids.  Will observe; if no improvement in mentation over 2 hrs will get CT head.  Lower suspicion currently for acute intracranial process as I suspect symptoms secondary to overdose.  Amount and/or Complexity of Data Reviewed  Independent Historian: EMS Labs: ordered. ECG/medicine tests: ordered.  Risk Decision regarding hospitalization. Diagnosis or treatment significantly limited by social determinants of health.         Final Clinical Impression(s) / ED Diagnoses Final diagnoses:  None    Rx / DC Orders ED Discharge Orders     None         Coral Spikes, DO 04/04/23 1051

## 2023-04-04 NOTE — ED Triage Notes (Signed)
 Pt BIB [facility] from hom as OD, mother found her in bathroom next to a "pink syringe," unknown downtime. Hx of heroine, cocaine & opioid use. Mom gave her 8 mg Narcan intranasally & she started to wake up & when EMS arrived she was confused/combative. EMS states she was covered in feces & had to be restrained in route for her safety. They gave her 5 mg Midazolam via IM, no PIV. 140/100, 95% RA, 120 bpm-regular on monitor, CBG WNL.

## 2023-04-04 NOTE — ED Notes (Signed)
 No restraints needed at this time, pt cooperative/calm.

## 2023-04-14 DIAGNOSIS — E872 Acidosis, unspecified: Secondary | ICD-10-CM | POA: Insufficient documentation

## 2023-04-14 DIAGNOSIS — M5090 Cervical disc disorder, unspecified, unspecified cervical region: Secondary | ICD-10-CM | POA: Insufficient documentation

## 2023-04-14 DIAGNOSIS — Z87898 Personal history of other specified conditions: Secondary | ICD-10-CM | POA: Insufficient documentation

## 2023-04-14 DIAGNOSIS — F0781 Postconcussional syndrome: Secondary | ICD-10-CM | POA: Insufficient documentation

## 2023-04-14 DIAGNOSIS — R9389 Abnormal findings on diagnostic imaging of other specified body structures: Secondary | ICD-10-CM | POA: Insufficient documentation

## 2023-04-14 DIAGNOSIS — Z720 Tobacco use: Secondary | ICD-10-CM | POA: Insufficient documentation

## 2023-04-14 DIAGNOSIS — N179 Acute kidney failure, unspecified: Secondary | ICD-10-CM | POA: Insufficient documentation

## 2023-04-14 DIAGNOSIS — Z87820 Personal history of traumatic brain injury: Secondary | ICD-10-CM | POA: Insufficient documentation

## 2023-04-14 DIAGNOSIS — E871 Hypo-osmolality and hyponatremia: Secondary | ICD-10-CM | POA: Insufficient documentation

## 2023-04-14 DIAGNOSIS — F1990 Other psychoactive substance use, unspecified, uncomplicated: Secondary | ICD-10-CM | POA: Insufficient documentation

## 2023-06-04 ENCOUNTER — Other Ambulatory Visit: Payer: Self-pay

## 2023-06-04 ENCOUNTER — Emergency Department

## 2023-06-04 ENCOUNTER — Observation Stay
Admission: EM | Admit: 2023-06-04 | Discharge: 2023-06-05 | Disposition: A | Discharge status: Home / Self Care | Attending: Internal Medicine | Admitting: Internal Medicine

## 2023-06-04 DIAGNOSIS — J45901 Unspecified asthma with (acute) exacerbation: Secondary | ICD-10-CM | POA: Diagnosis not present

## 2023-06-04 DIAGNOSIS — J189 Pneumonia, unspecified organism: Secondary | ICD-10-CM | POA: Diagnosis not present

## 2023-06-04 DIAGNOSIS — F17210 Nicotine dependence, cigarettes, uncomplicated: Secondary | ICD-10-CM | POA: Diagnosis not present

## 2023-06-04 DIAGNOSIS — I10 Essential (primary) hypertension: Secondary | ICD-10-CM | POA: Insufficient documentation

## 2023-06-04 DIAGNOSIS — S8262XA Displaced fracture of lateral malleolus of left fibula, initial encounter for closed fracture: Secondary | ICD-10-CM | POA: Insufficient documentation

## 2023-06-04 DIAGNOSIS — W108XXA Fall (on) (from) other stairs and steps, initial encounter: Secondary | ICD-10-CM | POA: Diagnosis not present

## 2023-06-04 DIAGNOSIS — J45909 Unspecified asthma, uncomplicated: Secondary | ICD-10-CM | POA: Diagnosis present

## 2023-06-04 DIAGNOSIS — R059 Cough, unspecified: Secondary | ICD-10-CM | POA: Diagnosis present

## 2023-06-04 DIAGNOSIS — J9601 Acute respiratory failure with hypoxia: Secondary | ICD-10-CM | POA: Diagnosis not present

## 2023-06-04 LAB — RESP PANEL BY RT-PCR (RSV, FLU A&B, COVID)  RVPGX2
Influenza A by PCR: NEGATIVE
Influenza B by PCR: NEGATIVE
Resp Syncytial Virus by PCR: NEGATIVE
SARS Coronavirus 2 by RT PCR: NEGATIVE

## 2023-06-04 LAB — CBC WITH DIFFERENTIAL/PLATELET
Abs Immature Granulocytes: 0.16 10*3/uL — ABNORMAL HIGH (ref 0.00–0.07)
Basophils Absolute: 0 10*3/uL (ref 0.0–0.1)
Basophils Relative: 0 %
Eosinophils Absolute: 0.3 10*3/uL (ref 0.0–0.5)
Eosinophils Relative: 3 %
HCT: 31 % — ABNORMAL LOW (ref 36.0–46.0)
Hemoglobin: 10.6 g/dL — ABNORMAL LOW (ref 12.0–15.0)
Immature Granulocytes: 2 %
Lymphocytes Relative: 23 %
Lymphs Abs: 2.6 10*3/uL (ref 0.7–4.0)
MCH: 27.4 pg (ref 26.0–34.0)
MCHC: 34.2 g/dL (ref 30.0–36.0)
MCV: 80.1 fL (ref 80.0–100.0)
Monocytes Absolute: 1.1 10*3/uL — ABNORMAL HIGH (ref 0.1–1.0)
Monocytes Relative: 10 %
Neutro Abs: 6.9 10*3/uL (ref 1.7–7.7)
Neutrophils Relative %: 62 %
Platelets: 390 10*3/uL (ref 150–400)
RBC: 3.87 MIL/uL (ref 3.87–5.11)
RDW: 13.9 % (ref 11.5–15.5)
WBC: 11 10*3/uL — ABNORMAL HIGH (ref 4.0–10.5)
nRBC: 0 % (ref 0.0–0.2)

## 2023-06-04 LAB — COMPREHENSIVE METABOLIC PANEL WITH GFR
ALT: 17 U/L (ref 0–44)
AST: 19 U/L (ref 15–41)
Albumin: 3.1 g/dL — ABNORMAL LOW (ref 3.5–5.0)
Alkaline Phosphatase: 51 U/L (ref 38–126)
Anion gap: 11 (ref 5–15)
BUN: 8 mg/dL (ref 6–20)
CO2: 23 mmol/L (ref 22–32)
Calcium: 8.8 mg/dL — ABNORMAL LOW (ref 8.9–10.3)
Chloride: 101 mmol/L (ref 98–111)
Creatinine, Ser: 0.71 mg/dL (ref 0.44–1.00)
GFR, Estimated: >60 mL/min (ref >60)
Glucose, Bld: 102 mg/dL — ABNORMAL HIGH (ref 70–99)
Potassium: 3.2 mmol/L — ABNORMAL LOW (ref 3.5–5.1)
Sodium: 135 mmol/L (ref 135–145)
Total Bilirubin: 0.6 mg/dL (ref 0.0–1.2)
Total Protein: 7.4 g/dL (ref 6.5–8.1)

## 2023-06-04 LAB — RESPIRATORY PANEL BY PCR

## 2023-06-04 LAB — TROPONIN I (HIGH SENSITIVITY)
Troponin I (High Sensitivity): 6 ng/L (ref <18)
Troponin I (High Sensitivity): 2 ng/L (ref <18)

## 2023-06-04 MED ORDER — IPRATROPIUM-ALBUTEROL 0.5-2.5 (3) MG/3ML IN SOLN
3.0000 mL | Freq: Three times a day (TID) | RESPIRATORY_TRACT | Status: DC
  Administered 2023-06-05 (×2): 3 mL via RESPIRATORY_TRACT
  Filled 2023-06-04 (×2): qty 3

## 2023-06-04 MED ORDER — QUETIAPINE FUMARATE ER 300 MG PO TB24: 300.0000 mg | ORAL_TABLET | Freq: Every day | ORAL | Status: DC

## 2023-06-04 MED ORDER — ACETAMINOPHEN 650 MG RE SUPP: 650.0000 mg | Freq: Four times a day (QID) | RECTAL | Status: DC | PRN

## 2023-06-04 MED ORDER — SODIUM CHLORIDE 0.9 % IV SOLN
2.0000 g | INTRAVENOUS | Status: DC
  Filled 2023-06-04: qty 20

## 2023-06-04 MED ORDER — SODIUM CHLORIDE 0.9 % IV SOLN
1.0000 g | Freq: Once | INTRAVENOUS | Status: AC
  Administered 2023-06-04: 1 g via INTRAVENOUS
  Filled 2023-06-04: qty 10

## 2023-06-04 MED ORDER — NICOTINE 7 MG/24HR TD PT24
7.0000 mg | MEDICATED_PATCH | Freq: Every day | TRANSDERMAL | Status: DC
  Administered 2023-06-04 – 2023-06-05 (×2): 7 mg via TRANSDERMAL
  Filled 2023-06-04 (×2): qty 1

## 2023-06-04 MED ORDER — METHYLPREDNISOLONE SODIUM SUCC 40 MG IJ SOLR
40.0000 mg | Freq: Two times a day (BID) | INTRAMUSCULAR | Status: DC
  Administered 2023-06-04 – 2023-06-05 (×2): 40 mg via INTRAVENOUS
  Filled 2023-06-04 (×2): qty 1

## 2023-06-04 MED ORDER — ALBUTEROL SULFATE (2.5 MG/3ML) 0.083% IN NEBU: 3.0000 mL | INHALATION_SOLUTION | Freq: Four times a day (QID) | RESPIRATORY_TRACT | Status: DC | PRN

## 2023-06-04 MED ORDER — IPRATROPIUM-ALBUTEROL 0.5-2.5 (3) MG/3ML IN SOLN
3.0000 mL | Freq: Once | RESPIRATORY_TRACT | Status: AC
Start: 2023-06-04 — End: 2023-06-04
  Administered 2023-06-04: 3 mL via RESPIRATORY_TRACT
  Filled 2023-06-04: qty 3

## 2023-06-04 MED ORDER — POLYETHYLENE GLYCOL 3350 17 G PO PACK: 17.0000 g | PACK | Freq: Every day | ORAL | Status: DC | PRN

## 2023-06-04 MED ORDER — CLONIDINE HCL 0.1 MG PO TABS: 0.1000 mg | ORAL_TABLET | Freq: Two times a day (BID) | ORAL | Status: DC

## 2023-06-04 MED ORDER — LACTULOSE 10 GM/15ML PO SOLN
30.0000 g | ORAL | Status: AC
  Administered 2023-06-04 (×2): 30 g via ORAL
  Filled 2023-06-04 (×3): qty 60

## 2023-06-04 MED ORDER — ACETAMINOPHEN 325 MG PO TABS: 650.0000 mg | ORAL_TABLET | Freq: Four times a day (QID) | ORAL | Status: DC | PRN

## 2023-06-04 MED ORDER — IPRATROPIUM-ALBUTEROL 0.5-2.5 (3) MG/3ML IN SOLN
3.0000 mL | Freq: Four times a day (QID) | RESPIRATORY_TRACT | Status: DC
  Administered 2023-06-04: 3 mL via RESPIRATORY_TRACT
  Filled 2023-06-04: qty 3

## 2023-06-04 MED ORDER — SODIUM CHLORIDE 0.9 % IV SOLN
500.0000 mg | Freq: Once | INTRAVENOUS | Status: AC
  Administered 2023-06-04: 500 mg via INTRAVENOUS
  Filled 2023-06-04: qty 5

## 2023-06-04 MED ORDER — SODIUM CHLORIDE 0.9 % IV SOLN
500.0000 mg | INTRAVENOUS | Status: DC
  Filled 2023-06-04: qty 5

## 2023-06-04 MED ORDER — SENNOSIDES-DOCUSATE SODIUM 8.6-50 MG PO TABS
1.0000 | ORAL_TABLET | Freq: Two times a day (BID) | ORAL | Status: DC
  Administered 2023-06-04 – 2023-06-05 (×2): 1 via ORAL
  Filled 2023-06-04 (×2): qty 1

## 2023-06-04 MED ORDER — BUDESONIDE 0.25 MG/2ML IN SUSP
0.2500 mg | Freq: Two times a day (BID) | RESPIRATORY_TRACT | Status: DC
  Administered 2023-06-04 – 2023-06-05 (×3): 0.25 mg via RESPIRATORY_TRACT
  Filled 2023-06-04 (×3): qty 2

## 2023-06-04 MED ORDER — ONDANSETRON 4 MG PO TBDP: 4.0000 mg | ORAL_TABLET | Freq: Three times a day (TID) | ORAL | Status: DC | PRN

## 2023-06-04 MED ORDER — GUAIFENESIN ER 600 MG PO TB12
1200.0000 mg | ORAL_TABLET | Freq: Two times a day (BID) | ORAL | Status: DC
  Administered 2023-06-04 – 2023-06-05 (×3): 1200 mg via ORAL
  Filled 2023-06-04 (×3): qty 2

## 2023-06-04 NOTE — ED Notes (Signed)
 See triage notes. Patient c/o cough times five to six days. Patient reported she has been using oxygen at home. Patient also c/o left ankle pain secondary to falling down the stairs two weeks ago.

## 2023-06-04 NOTE — Progress Notes (Signed)
 PIV consult noted. No current IV meds, will monitor for orders to place best line for ordered therapy. Conny Del, paramedic notified.

## 2023-06-04 NOTE — H&P (Signed)
 History and Physical    Vicki Whitehead ZOX:[id number] DOB: 1985-10-09 DOA: 06/04/2023  PCP: System, Provider Not In (Confirm with patient/family/NH records and if not entered, this has to be entered at [facility] point of entry) Patient coming from: Home  I have personally briefly reviewed patient's old medical records in [facility]  Chief Complaint: Cough, wheezing, SOB  HPI: Vicki Whitehead is a 38 y.o. female with medical history significant of mild intermittent asthma, chronic bronchitis, HTN, presented with worsening of cough wheezing shortness of breath.  Symptoms started 1 week ago, patient started develop productive cough with light yellowish sputum, day and night, has had chills but no fever.  Overlays started to have wheezing and increasing exertional dyspnea.  Denies any chest pains.  Last few days has been using around-the-clock albuterol  without significant improvement.  She also reported that 2 weeks ago she slipped and fell from stairs after missing steps, and twisted left ankle and started to have swelling and pain.  Patient denied any URI like symptoms before her respiratory symptoms.  ED Course: Afebrile, none tachycardia, hypoxia O2 saturation 84-88%, dropped to 78% on short ambulation.  Chest x-ray showed multifocal pneumonia, more prominent on right middle lobe.  Patient was started on ceftriaxone  azithromycin  and breathing treatment in the ED.  Review of Systems: As per HPI otherwise 14 point review of systems negative.    Past Medical History:  Diagnosis Date   Anemia    Asthma    Constipation    Hypertension    Sciatica     Past Surgical History:  Procedure Laterality Date   CESAREAN SECTION     TEE WITHOUT CARDIOVERSION N/A 10/13/2013   Procedure: TRANSESOPHAGEAL ECHOCARDIOGRAM (TEE);  Surgeon: Hazle Lites, MD;  Location: [facility] ENDOSCOPY;  Service: Cardiovascular;  Laterality: N/A;   TONSILLECTOMY       reports that she has been smoking cigarettes. She has  a 7.5 pack-year smoking history. She has never used smokeless tobacco. She reports current alcohol use. She reports current drug use. Drug: Methamphetamines.  No Known Allergies  History reviewed. No pertinent family history.   Prior to Admission medications   Medication Sig Start Date End Date Taking? Authorizing Provider  acetaminophen  (TYLENOL ) 500 MG tablet Take 500 mg by mouth every 6 (six) hours as needed for headache.     [provider]  albuterol  (PROVENTIL  HFA;VENTOLIN  HFA) 108 (90 BASE) MCG/ACT inhaler Inhale 2 puffs into the lungs every 6 (six) hours as needed for wheezing or shortness of breath.    [provider]  cloNIDine  (CATAPRES ) 0.1 MG tablet Take 0.1 mg by mouth 2 (two) times daily.    [provider]  ibuprofen  (ADVIL ,MOTRIN ) 600 MG tablet Take 600 mg by mouth every 6 (six) hours as needed.    [provider]  medroxyPROGESTERone  (DEPO-PROVERA ) 150 MG/ML injection Inject 150 mg into the muscle every 3 (three) months.    [provider]  naloxone  (NARCAN ) nasal spray 4 mg/0.1 mL Give if suspected overdose. 1 spray in nostril. 04/04/23   Rolinda Climes, DO  ondansetron  (ZOFRAN -ODT) 4 MG disintegrating tablet Take 4 mg by mouth every 8 (eight) hours as needed for nausea or vomiting.    [provider]  QUEtiapine  (SEROQUEL  XR) 300 MG 24 hr tablet Take 1 tablet (300 mg total) by mouth at bedtime. 03/12/14   Marven Slimmer, PA-C    Physical Exam: Vitals:   06/04/23 1039  BP: 118/80  Pulse: 80  Resp:  20  Temp: 98.5 F (36.9 C)  TempSrc: Oral  SpO2: 92%    Constitutional: NAD, calm, comfortable Vitals:   06/04/23 1039  BP: 118/80  Pulse: 80  Resp: 20  Temp: 98.5 F (36.9 C)  TempSrc: Oral  SpO2: 92%   Eyes: PERRL, lids and conjunctivae normal ENMT: Mucous membranes are moist. Posterior pharynx clear of any exudate or lesions.Normal dentition.  Neck: normal, supple, no masses, no thyromegaly Respiratory:  clear to auscultation bilaterally, diffused wheezing, scattered crackles more on the right side, increasing respiratory effort. No accessory muscle use.  Cardiovascular: Regular rate and rhythm, no murmurs / rubs / gallops. No extremity edema. 2+ pedal pulses. No carotid bruits.  Abdomen: no tenderness, no masses palpated. No hepatosplenomegaly. Bowel sounds positive.  Musculoskeletal: no clubbing / cyanosis. No joint deformity upper and lower extremities. Good ROM, no contractures. Normal muscle tone.  Skin: no rashes, lesions, ulcers. No induration Neurologic: CN 2-12 grossly intact. Sensation intact, DTR normal. Strength 5/5 in all 4.  Psychiatric: Normal judgment and insight. Alert and oriented x 3. Normal mood.     Labs on Admission: I have personally reviewed following labs and imaging studies  CBC: No results for input(s): "WBC", "NEUTROABS", "HGB", "HCT", "MCV", "PLT" in the last 168 hours. Basic Metabolic Panel: No results for input(s): "NA", "K", "CL", "CO2", "GLUCOSE", "BUN", "CREATININE", "CALCIUM", "MG", "PHOS" in the last 168 hours. GFR: CrCl cannot be calculated (Patient's most recent lab result is older than the maximum 21 days allowed.). Liver Function Tests: No results for input(s): "AST", "ALT", "ALKPHOS", "BILITOT", "PROT", "ALBUMIN" in the last 168 hours. No results for input(s): "LIPASE", "AMYLASE" in the last 168 hours. No results for input(s): "AMMONIA" in the last 168 hours. Coagulation Profile: No results for input(s): "INR", "PROTIME" in the last 168 hours. Cardiac Enzymes: No results for input(s): "CKTOTAL", "CKMB", "CKMBINDEX", "TROPONINI" in the last 168 hours. BNP (last 3 results) No results for input(s): "PROBNP" in the last 8760 hours. HbA1C: No results for input(s): "HGBA1C" in the last 72 hours. CBG: No results for input(s): "GLUCAP" in the last 168 hours. Lipid Profile: No results for input(s): "CHOL", "HDL", "LDLCALC", "TRIG", "CHOLHDL",  "LDLDIRECT" in the last 72 hours. Thyroid Function Tests: No results for input(s): "TSH", "T4TOTAL", "FREET4", "T3FREE", "THYROIDAB" in the last 72 hours. Anemia Panel: No results for input(s): "VITAMINB12", "FOLATE", "FERRITIN", "TIBC", "IRON", "RETICCTPCT" in the last 72 hours. Urine analysis:    Component Value Date/Time   COLORURINE YELLOW 10/11/2013 1914   APPEARANCEUR CLEAR 10/11/2013 1914   LABSPEC 1.020 10/11/2013 1914   PHURINE 5.5 10/11/2013 1914   GLUCOSEU NEGATIVE 10/11/2013 1914   HGBUR NEGATIVE 10/11/2013 1914   BILIRUBINUR NEGATIVE 10/11/2013 1914   KETONESUR NEGATIVE 10/11/2013 1914   PROTEINUR NEGATIVE 10/11/2013 1914   UROBILINOGEN 0.2 10/11/2013 1914   NITRITE NEGATIVE 10/11/2013 1914   LEUKOCYTESUR TRACE (A) 10/11/2013 1914    Radiological Exams on Admission: DG Chest 2 View Result Date: 06/04/2023 CLINICAL DATA:  Cough for 5 days. EXAM: CHEST - 2 VIEW COMPARISON:  Remote radiograph 10/11/2013 FINDINGS: Multifocal bilateral patchy opacities, greatest in the right perihilar region. Mild central bronchial thickening. The heart is normal in size. There may be a trace right pleural effusion. No pulmonary edema. No pneumothorax. On limited assessment, no acute osseous findings. IMPRESSION: 1. Multifocal bilateral patchy opacities, greatest in the right perihilar region, suspicious for multifocal pneumonia. 2. Mild central bronchial thickening. 3. Possible trace right pleural effusion. Electronically Signed   By: Prentice Brochure  Sanford M.D.   On: 06/04/2023 12:16   DG Ankle Complete Left Result Date: 06/04/2023 CLINICAL DATA:  Left ankle pain after injury.  Fall down stairs. EXAM: LEFT ANKLE COMPLETE - 3+ VIEW COMPARISON:  None Available. FINDINGS: Small ossific density adjacent to the medial malleolus may represent an avulsion type fracture. Corticated density distal to the medial malleolus has a chronic appearance. The ankle mortise is preserved. There may be a small ankle joint  effusion. Mild generalized soft tissue edema. IMPRESSION: 1. Small ossific density adjacent to the medial malleolus may represent an avulsion type fracture. 2. Mild generalized soft tissue edema. Electronically Signed   By: Chadwick Colonel M.D.   On: 06/04/2023 12:15    EKG: Independently reviewed.  Sinus tachycardia, no acute ST changes.  Assessment/Plan Principal Problem:   CAP (community acquired pneumonia) Active Problems:   Asthma  (please populate well all problems here in Problem List. (For example, if patient is on BP meds at home and you resume or decide to hold them, it is a problem that needs to be her. Same for CAD, COPD, HLD and so on)  Acute hypoxic respiratory failure - Secondary to combined effect of CAP bacterial and acute asthma exacerbation.  CAP, bacterial - Continue ceftriaxone  and azithromycin - Check sputum culture - Check atypical pneumonia study mycoplasma and Legionella  Acute asthma exacerbation - IV Solu-Medrol - Guaifenesin - DuoNebs and as needed albuterol  - Pulmicort  Left malleolus fracture - Conservative management, Camp boot and outpatient follow-up with orthopedic surgery  Cigarette smoking - Cessation education performed at bedside - Nicotine  patch  DVT prophylaxis: SCD Code Status: Full code Family Communication: Mother at bedside Disposition Plan: Expect less than 2 midnight hospital stay Consults called: None Admission status: MedSurg observation   [place] MD [facility] Pager [phone]  06/04/2023, 1:04 PM

## 2023-06-04 NOTE — ED Provider Notes (Signed)
 [facility] Provider Note    Event Date/Time   First MD Initiated Contact with Patient 06/04/23 1058     (approximate)   History   Cough   HPI Kinslea Frances is a 38 y.o. female presenting to the emergency department with cough x 5 to 6 days.  States that it has been occasionally productive, but mostly nonproductive.  She has been using over-the-counter cough/flu medication, Mucinex  with minimal relief. Denies fever, otalgia, sore throat, chest pain, nausea, vomiting, diarrhea.  Does endorse some difficulty breathing. Past medical history does include asthma, hypertension, anemia, IVDU, polysubstance abuse with fentanyl  use 2 months ago.  No recent travel.  No sick contacts.  Patient does not work or attend school.  Family member is in the room and states she has been providing her with oxygen for the past 2 days on 2 L and it has been 84 to 86%. Patient does not normally use oxygen at home. Denies use of narcotics.  Patient also reports left ankle pain after falling down the stairs 2 weeks ago.  Has not taken anything for the pain.  Has been ambulating since the fall. Denies any allergies.     Physical Exam   Triage Vital Signs: ED Triage Vitals [06/04/23 1039]  Encounter Vitals Group     BP 118/80     Systolic BP Percentile      Diastolic BP Percentile      Pulse Rate 80     Resp 20     Temp 98.5 F (36.9 C)     Temp Source Oral     SpO2 92 %     Weight      Height      Head Circumference      Peak Flow      Pain Score 5     Pain Loc      Pain Education      Exclude from Growth Chart     Most recent vital signs: Vitals:   06/04/23 1039 06/04/23 1357  BP: 118/80 116/77  Pulse: 80 95  Resp: 20 17  Temp: 98.5 F (36.9 C) 98.4 F (36.9 C)  SpO2: 92% 95%   General: No acute distress. Coughing in room. Appears stated age. Head: Normocephalic, atraumatic. Eyes: PERRLA. No scleral icterus or conjunctival injection. Ears/Nose/Throat: TMs  intact b/l. Nares patent, no nasal discharge. Oropharynx moist, no erythema or exudate. Dentition intact. Neck: Supple, no nuchal rigidity. CV: Regular rate. No murmurs, rubs, or gallops. Peripheral pulses 2+ and symmetric. No edema. Respiratory: Diffuse bilateral expiratory rhonchi. No respiratory distress. Normal respiratory effort. GI: Soft, non-distended, non-tender. No rebound or guarding. MSK: Grossly normal ROM and 5/5 strength in bilateral ankles and toes.  Tender to palpation of medial malleolus on the left.  Skin:Warm, dry, intact. No rashes or ecchymosis.  Neurological: A&Ox4 to person, place, time, and situation.  Psychiatric: Mood and affect appropriate. Thought processes coherent.   ED Results / Procedures / Treatments   Labs (all labs ordered are listed, but only abnormal results are displayed) Labs Reviewed  CBC WITH DIFFERENTIAL/PLATELET - Abnormal; Notable for the following components:      Result Value   WBC 11.0 (*)    Hemoglobin 10.6 (*)    HCT 31.0 (*)    Monocytes Absolute 1.1 (*)    Abs Immature Granulocytes 0.16 (*)    All other components within normal limits  COMPREHENSIVE METABOLIC PANEL WITH GFR - Abnormal; Notable for the following  components:   Potassium 3.2 (*)    Glucose, Bld 102 (*)    Calcium 8.8 (*)    Albumin 3.1 (*)    All other components within normal limits  RESP PANEL BY RT-PCR (RSV, FLU A&B, COVID)  RVPGX2  CULTURE, BLOOD (ROUTINE X 2)  CULTURE, BLOOD (ROUTINE X 2)  RESPIRATORY PANEL BY PCR  EXPECTORATED SPUTUM ASSESSMENT W GRAM STAIN, RFLX TO RESP C  MYCOPLASMA PNEUMONIAE ANTIBODY, IGM  LEGIONELLA PNEUMOPHILA SEROGP 1 UR AG  POC URINE PREG, ED  TROPONIN I (HIGH SENSITIVITY)  TROPONIN I (HIGH SENSITIVITY)     EKG  Not ordered   RADIOLOGY Chest x-ray and left ankle x-ray ordered.  Left Ankle IMPRESSION: 1. Small ossific density adjacent to the medial malleolus may represent an avulsion type fracture. 2. Mild generalized  soft tissue edema.   CXR IMPRESSION: 1. Multifocal bilateral patchy opacities, greatest in the right perihilar region, suspicious for multifocal pneumonia. 2. Mild central bronchial thickening. 3. Possible trace right pleural effusion.  Imaging was independently reviewed and interpreted by me as well as the radiologist. I agree with the radiologists' reports.  PROCEDURES:  Critical Care performed: No  Procedures   MEDICATIONS ORDERED IN ED: Medications  azithromycin (ZITHROMAX) 500 mg in sodium chloride  0.9 % 250 mL IVPB (500 mg Intravenous New Bag/Given 06/04/23 1316)  ondansetron  (ZOFRAN -ODT) disintegrating tablet 4 mg (has no administration in time range)  albuterol  (PROVENTIL ) (2.5 MG/3ML) 0.083% nebulizer solution 3 mL (has no administration in time range)  cefTRIAXone  (ROCEPHIN ) 2 g in sodium chloride  0.9 % 100 mL IVPB (has no administration in time range)  azithromycin (ZITHROMAX) 500 mg in sodium chloride  0.9 % 250 mL IVPB (has no administration in time range)  ipratropium-albuterol  (DUONEB) 0.5-2.5 (3) MG/3ML nebulizer solution 3 mL (has no administration in time range)  methylPREDNISolone sodium succinate (SOLU-MEDROL) 40 mg/mL injection 40 mg (40 mg Intravenous Given 06/04/23 1313)  guaiFENesin (MUCINEX) 12 hr tablet 1,200 mg (1,200 mg Oral Given 06/04/23 1312)  acetaminophen  (TYLENOL ) tablet 650 mg (has no administration in time range)    Or  acetaminophen  (TYLENOL ) suppository 650 mg (has no administration in time range)  nicotine  (NICODERM CQ  - dosed in mg/24 hr) patch 7 mg (7 mg Transdermal Patch Applied 06/04/23 1322)  budesonide (PULMICORT) nebulizer solution 0.25 mg (0.25 mg Nebulization Given 06/04/23 1413)  ipratropium-albuterol  (DUONEB) 0.5-2.5 (3) MG/3ML nebulizer solution 3 mL (3 mLs Nebulization Given 06/04/23 1131)  cefTRIAXone  (ROCEPHIN ) 1 g in sodium chloride  0.9 % 100 mL IVPB (0 g Intravenous Stopped 06/04/23 1328)     IMPRESSION / MDM / ASSESSMENT AND PLAN /  ED COURSE  I reviewed the triage vital signs and the nursing notes.                              Differential diagnosis includes, but is not limited to, pneumonia, viral URI, ankle sprain, medial malleolus fracture, opioid intoxication or withdrawal  Patient's presentation is most consistent with acute complicated illness / injury requiring diagnostic workup.  Patient presented today with a cough x 5 or 6 days with O2 sats 84 to 86% at home for the past 2 days.  Patient was given 1 DuoNeb, states this helped.  Labs ordered including CBC, CMP, UPT, respiratory panel, blood cultures, troponin.  Respiratory panel negative for COVID, flu, RSV.  CBC shows elevated white blood cell count of 11.0, hemoglobin 10.6 hematocrit 31.0.  CMP shows potassium  of 3.2, calcium 8.8, albumin 3.1. Chest x-ray was ordered and showed multifocal bilateral patchy opacities concerning for multifocal pneumonia with possible trace right pleural effusion.  Blood cultures and troponin are in process. She also had a fall 2 weeks ago with left ankle pain, x-ray ordered and showed avulsion type fracture on medial malleolus of left ankle and soft tissue edema.  Patient was placed in a cam boot.    Due to her history of IV drug use and recent fentanyl  use with O2 sats of 84-86% at home and 92% at rest here and her chest x-ray showing multifocal pneumonia, I feel it is best to admit her for IV antibiotics and monitoring of vital signs for observation.  She was started on ceftriaxone  and azithromycin  IV in the emergency department. Hospitalist team was consulted.  She will need to follow-up with orthopedics as well once discharged. Dr. Antoniette Batty with hospitalist team will take over her care from here.     FINAL CLINICAL IMPRESSION(S) / ED DIAGNOSES   Final diagnoses:  Multifocal pneumonia     Rx / DC Orders   ED Discharge Orders     None        Note:  This document was prepared using Dragon voice recognition software  and may include unintentional dictation errors.    Thomasenia Flesher, PA-C 06/04/23 1415    Bryson Carbine, MD 06/04/23 [phone]

## 2023-06-04 NOTE — Plan of Care (Signed)
  Problem: Clinical Measurements: Goal: Will remain free from infection Outcome: Progressing   Problem: Safety: Goal: Ability to remain free from injury will improve Outcome: Progressing   

## 2023-06-04 NOTE — ED Notes (Signed)
 Unsuccessful attempt at blood draw by the Paramedic.

## 2023-06-04 NOTE — ED Triage Notes (Signed)
 Pt to ED via POV from home. Pt reports cough x5-6 days. Pt reports oxygen at home has been running 84-86%. Pt reports fell down stairs 2 wks ago and reports continued left ankle pain.

## 2023-06-04 NOTE — Plan of Care (Signed)

## 2023-06-05 DIAGNOSIS — J189 Pneumonia, unspecified organism: Secondary | ICD-10-CM | POA: Diagnosis not present

## 2023-06-05 LAB — CBC
HCT: 35.8 % — ABNORMAL LOW (ref 36.0–46.0)
Hemoglobin: 12.1 g/dL (ref 12.0–15.0)
MCH: 27.2 pg (ref 26.0–34.0)
MCHC: 33.8 g/dL (ref 30.0–36.0)
MCV: 80.4 fL (ref 80.0–100.0)
Platelets: 429 10*3/uL — ABNORMAL HIGH (ref 150–400)
RBC: 4.45 MIL/uL (ref 3.87–5.11)
RDW: 13.7 % (ref 11.5–15.5)
WBC: 17.2 10*3/uL — ABNORMAL HIGH (ref 4.0–10.5)
nRBC: 0 % (ref 0.0–0.2)

## 2023-06-05 LAB — BASIC METABOLIC PANEL WITH GFR
Anion gap: 6 (ref 5–15)
BUN: 12 mg/dL (ref 6–20)
CO2: 27 mmol/L (ref 22–32)
Calcium: 9.3 mg/dL (ref 8.9–10.3)
Chloride: 102 mmol/L (ref 98–111)
Creatinine, Ser: 0.82 mg/dL (ref 0.44–1.00)
GFR, Estimated: >60 mL/min (ref >60)
Glucose, Bld: 150 mg/dL — ABNORMAL HIGH (ref 70–99)
Potassium: 4.1 mmol/L (ref 3.5–5.1)
Sodium: 135 mmol/L (ref 135–145)

## 2023-06-05 LAB — MYCOPLASMA PNEUMONIAE ANTIBODY, IGM: Mycoplasma pneumo IgM: 824 U/mL — ABNORMAL HIGH (ref 0–769)

## 2023-06-05 MED ORDER — ALBUTEROL SULFATE HFA 108 (90 BASE) MCG/ACT IN AERS: 2.0000 | INHALATION_SPRAY | Freq: Four times a day (QID) | RESPIRATORY_TRACT | 1 refills | Status: AC | PRN

## 2023-06-05 MED ORDER — BUDESONIDE-FORMOTEROL FUMARATE 80-4.5 MCG/ACT IN AERO: 2.0000 | INHALATION_SPRAY | Freq: Two times a day (BID) | RESPIRATORY_TRACT | 12 refills | Status: DC

## 2023-06-05 MED ORDER — CEFDINIR 300 MG PO CAPS: 300.0000 mg | ORAL_CAPSULE | Freq: Two times a day (BID) | ORAL | 0 refills | Status: AC

## 2023-06-05 MED ORDER — AZITHROMYCIN 500 MG PO TABS: 500.0000 mg | ORAL_TABLET | Freq: Every day | ORAL | 0 refills | Status: AC

## 2023-06-05 MED ORDER — GUAIFENESIN ER 600 MG PO TB12: 1200.0000 mg | ORAL_TABLET | Freq: Two times a day (BID) | ORAL | 0 refills | Status: AC | PRN

## 2023-06-05 MED ORDER — PREDNISONE 20 MG PO TABS: 40.0000 mg | ORAL_TABLET | Freq: Every day | ORAL | 0 refills | Status: AC

## 2023-06-05 NOTE — Progress Notes (Signed)
 Patient sleeping, sputum cup left at bedside.

## 2023-06-05 NOTE — TOC Transition Note (Signed)
 Transition of Care [facility]) - Discharge Note   Patient Details  Name: Vicki Whitehead MRN: [id number] Date of Birth: 06/16/1985  Transition of Care [facility]) CM/SW Contact:  Alexandra Ice, RN Phone Number: 06/05/2023, 2:03 PM   Clinical Narrative:     Patient to discharge home today. No TOC needs identified.   Final next level of care: Home/Self Care Barriers to Discharge: Barriers Resolved   Patient Goals and CMS Choice            Discharge Placement                  Name of family member notified: Mom - Dala Dublin Patient and family notified of of transfer: 06/05/23  Discharge Plan and Services Additional resources added to the After Visit Summary for                    DME Agency: NA       HH Arranged: NA          Social Drivers of Health (SDOH) Interventions SDOH Screenings   Food Insecurity: No Food Insecurity (04/14/2023)   [facility]  Transportation Needs: No Transportation Needs (04/14/2023)   Received from [facility]  Social Connections: Unknown (05/16/2021)   Received from [facility]  Tobacco Use: High Risk (06/04/2023)     Readmission Risk Interventions     No data to display

## 2023-06-05 NOTE — Discharge Summary (Signed)
 Physician Discharge Summary   Patient: Vicki Whitehead MRN: [id number] DOB: 01-Jan-1986  Admit date:     06/04/2023  Discharge date: 06/05/2023  Discharge Physician: Montey Apa   PCP: System, Provider Not In   Recommendations at discharge:    Follow up with Primary Care in 1-2 weeks Repeat CBC, CMP at follow up Follow up on resolution / recovery from community-acquired pneumonia  Discharge Diagnoses: Principal Problem:   CAP (community acquired pneumonia) Active Problems:   Asthma  Resolved Problems:   * No resolved hospital problems. [facility] Course:  HPI on admission: " Vicki Whitehead is a 38 y.o. female with medical history significant of mild intermittent asthma, chronic bronchitis, HTN, presented with worsening of cough wheezing shortness of breath.   Symptoms started 1 week ago, patient started develop productive cough with light yellowish sputum, day and night, has had chills but no fever.  Overlays started to have wheezing and increasing exertional dyspnea.  Denies any chest pains.  Last few days has been using around-the-clock albuterol  without significant improvement.  She also reported that 2 weeks ago she slipped and fell from stairs after missing steps, and twisted left ankle and started to have swelling and pain.  Patient denied any URI like symptoms before her respiratory symptoms.   ED Course: Afebrile, none tachycardia, hypoxia O2 saturation 84-88%, dropped to 78% on short ambulation.  Chest x-ray showed multifocal pneumonia, more prominent on right middle lobe.   Patient was started on ceftriaxone  azithromycin  and breathing treatment in the ED."   Patient admitted for observation and further IV antibiotics for community-acquired pneumonia.    5/29 -- pt reports feeling much better today.  Denies F/C, cough improved, no SOB.  Not requiring oxygen.  Patient is adequately improved and medically stable, requesting discharge home with oral antibiotics  today.   Assessment and Plan: No notes have been filed under this hospital service. Service: Hospitalist        Consultants: None Procedures performed: None  Disposition: Home Diet recommendation:  Discharge Diet Orders (From admission, onward)     Start     Ordered   06/05/23 0000  Diet - Regular    06/05/23 1213            DISCHARGE MEDICATION: Allergies as of 06/05/2023       Reactions   Pneumococcal Vaccine Other (See Comments)   cellulitis        Medication List     STOP taking these medications    cloNIDine  0.1 MG tablet Commonly known as: CATAPRES    ibuprofen  600 MG tablet Commonly known as: ADVIL    naloxone  4 MG/0.1ML Liqd nasal spray kit Commonly known as: NARCAN    ondansetron  4 MG disintegrating tablet Commonly known as: ZOFRAN -ODT   QUEtiapine  300 MG 24 hr tablet Commonly known as: SEROQUEL  XR       TAKE these medications    acetaminophen  500 MG tablet Commonly known as: TYLENOL  Take 500 mg by mouth every 6 (six) hours as needed for headache.   albuterol  108 (90 Base) MCG/ACT inhaler Commonly known as: VENTOLIN  HFA Inhale 2 puffs into the lungs every 6 (six) hours as needed for wheezing or shortness of breath.   azithromycin  500 MG tablet Commonly known as: Zithromax  Take 1 tablet (500 mg total) by mouth daily for 4 days.   budesonide -formoterol  80-4.5 MCG/ACT inhaler Commonly known as: Symbicort  Inhale 2 puffs into the lungs 2 (two) times daily.   cefdinir  300 MG capsule Commonly  known as: OMNICEF  Take 1 capsule (300 mg total) by mouth 2 (two) times daily for 4 days.   guaiFENesin  600 MG 12 hr tablet Commonly known as: MUCINEX  Take 2 tablets (1,200 mg total) by mouth 2 (two) times daily as needed for up to 10 days.   medroxyPROGESTERone  150 MG/ML injection Commonly known as: DEPO-PROVERA  Inject 150 mg into the muscle every 3 (three) months.   predniSONE  20 MG tablet Commonly known as: DELTASONE  Take 2 tablets (40  mg total) by mouth daily with breakfast for 4 days.        Discharge Exam: Filed Weights   06/04/23 1533  Weight: 89.4 kg   General exam: awake, alert, no acute distress HEENT: atraumatic, clear conjunctiva, anicteric sclera, moist mucus membranes, hearing grossly normal  Respiratory system: CTAB, no wheezes, rales or rhonchi, normal respiratory effort. Cardiovascular system: normal S1/S2, RRR, no JVD, murmurs, rubs, gallops,  no pedal edema.   Gastrointestinal system: soft, NT, ND, no HSM felt, +bowel sounds. Central nervous system: A&O x3. no gross focal neurologic deficits, normal speech Extremities: moves all , no edema, normal tone Skin: dry, intact, normal temperature, normal color, No rashes, lesions or ulcers Psychiatry: normal mood, congruent affect, judgement and insight appear normal   Condition at discharge: stable  The results of significant diagnostics from this hospitalization (including imaging, microbiology, ancillary and laboratory) are listed below for reference.   Imaging Studies: DG Chest 2 View Result Date: 06/04/2023 CLINICAL DATA:  Cough for 5 days. EXAM: CHEST - 2 VIEW COMPARISON:  Remote radiograph 10/11/2013 FINDINGS: Multifocal bilateral patchy opacities, greatest in the right perihilar region. Mild central bronchial thickening. The heart is normal in size. There may be a trace right pleural effusion. No pulmonary edema. No pneumothorax. On limited assessment, no acute osseous findings. IMPRESSION: 1. Multifocal bilateral patchy opacities, greatest in the right perihilar region, suspicious for multifocal pneumonia. 2. Mild central bronchial thickening. 3. Possible trace right pleural effusion. Electronically Signed   By: Chadwick Colonel M.D.   On: 06/04/2023 12:16   DG Ankle Complete Left Result Date: 06/04/2023 CLINICAL DATA:  Left ankle pain after injury.  Fall down stairs. EXAM: LEFT ANKLE COMPLETE - 3+ VIEW COMPARISON:  None Available. FINDINGS: Small  ossific density adjacent to the medial malleolus may represent an avulsion type fracture. Corticated density distal to the medial malleolus has a chronic appearance. The ankle mortise is preserved. There may be a small ankle joint effusion. Mild generalized soft tissue edema. IMPRESSION: 1. Small ossific density adjacent to the medial malleolus may represent an avulsion type fracture. 2. Mild generalized soft tissue edema. Electronically Signed   By: Chadwick Colonel M.D.   On: 06/04/2023 12:15    Microbiology: Results for orders placed or performed during the hospital encounter of 06/04/23  Resp panel by RT-PCR (RSV, Flu A&B, Covid) Anterior Nasal Swab     Status: None   Collection Time: 06/04/23 10:37 AM   Specimen: Anterior Nasal Swab  Result Value Ref Range Status   SARS Coronavirus 2 by RT PCR NEGATIVE NEGATIVE Final    Comment: (NOTE) SARS-CoV-2 target nucleic acids are NOT DETECTED.  The SARS-CoV-2 RNA is generally detectable in upper respiratory specimens during the acute phase of infection. The lowest concentration of SARS-CoV-2 viral copies this assay can detect is 138 copies/mL. A negative result does not preclude SARS-Cov-2 infection and should not be used as the sole basis for treatment or other patient management decisions. A negative result may occur with  improper specimen collection/handling, submission of specimen other than nasopharyngeal swab, presence of viral mutation(s) within the areas targeted by this assay, and inadequate number of viral copies(<138 copies/mL). A negative result must be combined with clinical observations, patient history, and epidemiological information. The expected result is Negative.  Fact Sheet for Patients:  BloggerCourse.com  Fact Sheet for Healthcare Providers:  SeriousBroker.it  This test is no t yet approved or cleared by the [place]  FDA and  has been authorized for detection  and/or diagnosis of SARS-CoV-2 by FDA under an Emergency Use Authorization (EUA). This EUA will remain  in effect (meaning this test can be used) for the duration of the COVID-19 declaration under Section 564(b)(1) of the Act, 21 U.S.C.section 360bbb-3(b)(1), unless the authorization is terminated  or revoked sooner.       Influenza A by PCR NEGATIVE NEGATIVE Final   Influenza B by PCR NEGATIVE NEGATIVE Final    Comment: (NOTE) The Xpert Xpress SARS-CoV-2/FLU/RSV plus assay is intended as an aid in the diagnosis of influenza from Nasopharyngeal swab specimens and should not be used as a sole basis for treatment. Nasal washings and aspirates are unacceptable for Xpert Xpress SARS-CoV-2/FLU/RSV testing.  Fact Sheet for Patients: BloggerCourse.com  Fact Sheet for Healthcare Providers: SeriousBroker.it  This test is not yet approved or cleared by the [place]  FDA and has been authorized for detection and/or diagnosis of SARS-CoV-2 by FDA under an Emergency Use Authorization (EUA). This EUA will remain in effect (meaning this test can be used) for the duration of the COVID-19 declaration under Section 564(b)(1) of the Act, 21 U.S.C. section 360bbb-3(b)(1), unless the authorization is terminated or revoked.     Resp Syncytial Virus by PCR NEGATIVE NEGATIVE Final    Comment: (NOTE) Fact Sheet for Patients: BloggerCourse.com  Fact Sheet for Healthcare Providers: SeriousBroker.it  This test is not yet approved or cleared by the [place]  FDA and has been authorized for detection and/or diagnosis of SARS-CoV-2 by FDA under an Emergency Use Authorization (EUA). This EUA will remain in effect (meaning this test can be used) for the duration of the COVID-19 declaration under Section 564(b)(1) of the Act, 21 U.S.C. section 360bbb-3(b)(1), unless the authorization is terminated  or revoked.  Performed at [facility], [address] [place], [place] [address]   Blood culture (routine x 2)     Status: None (Preliminary result)   Collection Time: 06/04/23 12:40 PM   Specimen: BLOOD  Result Value Ref Range Status   Specimen Description BLOOD BLOOD RIGHT FOREARM  Final   Special Requests   Final    BOTTLES DRAWN AEROBIC AND ANAEROBIC Blood Culture results may not be optimal due to an inadequate volume of blood received in culture bottles   Culture   Final    NO GROWTH < 24 HOURS Performed at [facility], [address], [place], [place] [address]    Report Status PENDING  Incomplete  Blood culture (routine x 2)     Status: None (Preliminary result)   Collection Time: 06/04/23 12:44 PM   Specimen: BLOOD  Result Value Ref Range Status   Specimen Description BLOOD LEFT ANTECUBITAL  Final   Special Requests   Final    BOTTLES DRAWN AEROBIC AND ANAEROBIC Blood Culture results may not be optimal due to an inadequate volume of blood received in culture bottles   Culture   Final    NO GROWTH < 24 HOURS Performed at [facility], [address] [place], [place] [address]    Report Status PENDING  Incomplete  Respiratory (~20 pathogens) panel by PCR     Status: None   Collection Time: 06/04/23  1:20 PM   Specimen: Nasopharyngeal Swab; Respiratory  Result Value Ref Range Status   Adenovirus NOT DETECTED NOT DETECTED Final   Coronavirus 229E NOT DETECTED NOT DETECTED Final    Comment: (NOTE) The Coronavirus on the Respiratory Panel, DOES NOT test for the novel  Coronavirus (2019 nCoV)    Coronavirus HKU1 NOT DETECTED NOT DETECTED Final   Coronavirus NL63 NOT DETECTED NOT DETECTED Final   Coronavirus OC43 NOT DETECTED NOT DETECTED Final   Metapneumovirus NOT DETECTED NOT DETECTED Final   Rhinovirus / Enterovirus NOT DETECTED NOT DETECTED Final   Influenza A NOT DETECTED NOT DETECTED Final   Influenza B NOT DETECTED NOT DETECTED  Final   Parainfluenza Virus 1 NOT DETECTED NOT DETECTED Final   Parainfluenza Virus 2 NOT DETECTED NOT DETECTED Final   Parainfluenza Virus 3 NOT DETECTED NOT DETECTED Final   Parainfluenza Virus 4 NOT DETECTED NOT DETECTED Final   Respiratory Syncytial Virus NOT DETECTED NOT DETECTED Final   Bordetella pertussis NOT DETECTED NOT DETECTED Final   Bordetella Parapertussis NOT DETECTED NOT DETECTED Final   Chlamydophila pneumoniae NOT DETECTED NOT DETECTED Final   Mycoplasma pneumoniae NOT DETECTED NOT DETECTED Final    Comment: Performed at [facility], [address] [place], [place] [address]    Labs: CBC: Recent Labs  Lab 06/04/23 1240 06/05/23 0956  WBC 11.0* 17.2*  NEUTROABS 6.9  --   HGB 10.6* 12.1  HCT 31.0* 35.8*  MCV 80.1 80.4  PLT 390 429*   Basic Metabolic Panel: Recent Labs  Lab 06/04/23 1240 06/05/23 0956  NA 135 135  K 3.2* 4.1  CL 101 102  CO2 23 27  GLUCOSE 102* 150*  BUN 8 12  CREATININE 0.71 0.82  CALCIUM 8.8* 9.3   Liver Function Tests: Recent Labs  Lab 06/04/23 1240  AST 19  ALT 17  ALKPHOS 51  BILITOT 0.6  PROT 7.4  ALBUMIN 3.1*   CBG: No results for input(s): "GLUCAP" in the last 168 hours.  Discharge time spent: less than 30 minutes.  Signed: Montey Apa, DO [facility] 06/05/2023

## 2023-06-07 LAB — LEGIONELLA PNEUMOPHILA SEROGP 1 UR AG: L. pneumophila Serogp 1 Ur Ag: NEGATIVE

## 2023-06-12 LAB — CULTURE, BLOOD (ROUTINE X 2)
Culture: NO GROWTH
Culture: NO GROWTH

## 2023-06-23 ENCOUNTER — Ambulatory Visit
Admission: EM | Admit: 2023-06-23 | Discharge: 2023-06-23 | Disposition: A | Discharge status: Home / Self Care | Attending: Physician Assistant | Admitting: Physician Assistant

## 2023-06-23 DIAGNOSIS — R0981 Nasal congestion: Secondary | ICD-10-CM | POA: Diagnosis not present

## 2023-06-23 DIAGNOSIS — H9201 Otalgia, right ear: Secondary | ICD-10-CM | POA: Diagnosis not present

## 2023-06-23 MED ORDER — AMOXICILLIN-POT CLAVULANATE 875-125 MG PO TABS: 1.0000 | ORAL_TABLET | Freq: Two times a day (BID) | ORAL | 0 refills | Status: DC
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
## 2023-06-23 NOTE — ED Triage Notes (Signed)
 Pt reports right ear pain, feels off balance x 6 days

## 2023-07-22 ENCOUNTER — Emergency Department: Admission: EM | Admit: 2023-07-22 | Discharge: 2023-07-22 | Disposition: A | Discharge status: Home / Self Care

## 2023-07-22 ENCOUNTER — Other Ambulatory Visit: Payer: Self-pay

## 2023-07-22 DIAGNOSIS — I10 Essential (primary) hypertension: Secondary | ICD-10-CM | POA: Insufficient documentation

## 2023-07-22 DIAGNOSIS — J45909 Unspecified asthma, uncomplicated: Secondary | ICD-10-CM | POA: Diagnosis not present

## 2023-07-22 DIAGNOSIS — M79671 Pain in right foot: Secondary | ICD-10-CM | POA: Diagnosis present

## 2023-07-22 DIAGNOSIS — L02611 Cutaneous abscess of right foot: Secondary | ICD-10-CM | POA: Insufficient documentation

## 2023-07-22 HISTORY — DX: Depression, unspecified: F32.A

## 2023-07-22 LAB — CBC WITH DIFFERENTIAL/PLATELET
Abs Immature Granulocytes: 0.04 K/uL (ref 0.00–0.07)
Basophils Absolute: 0 K/uL (ref 0.0–0.1)
Basophils Relative: 0 %
Eosinophils Absolute: 0.1 K/uL (ref 0.0–0.5)
Eosinophils Relative: 1 %
HCT: 28.7 % — ABNORMAL LOW (ref 36.0–46.0)
Hemoglobin: 9.3 g/dL — ABNORMAL LOW (ref 12.0–15.0)
Immature Granulocytes: 0 %
Lymphocytes Relative: 19 %
Lymphs Abs: 1.7 K/uL (ref 0.7–4.0)
MCH: 25.5 pg — ABNORMAL LOW (ref 26.0–34.0)
MCHC: 32.4 g/dL (ref 30.0–36.0)
MCV: 78.6 fL — ABNORMAL LOW (ref 80.0–100.0)
Monocytes Absolute: 0.7 K/uL (ref 0.1–1.0)
Monocytes Relative: 8 %
Neutro Abs: 6.4 K/uL (ref 1.7–7.7)
Neutrophils Relative %: 72 %
Platelets: 315 K/uL (ref 150–400)
RBC: 3.65 MIL/uL — ABNORMAL LOW (ref 3.87–5.11)
RDW: 15.3 % (ref 11.5–15.5)
WBC: 9 K/uL (ref 4.0–10.5)
nRBC: 0 % (ref 0.0–0.2)

## 2023-07-22 LAB — COMPREHENSIVE METABOLIC PANEL WITH GFR
ALT: 24 U/L (ref 0–44)
AST: 24 U/L (ref 15–41)
Albumin: 3 g/dL — ABNORMAL LOW (ref 3.5–5.0)
Alkaline Phosphatase: 157 U/L — ABNORMAL HIGH (ref 38–126)
Anion gap: 11 (ref 5–15)
BUN: 15 mg/dL (ref 6–20)
CO2: 23 mmol/L (ref 22–32)
Calcium: 9 mg/dL (ref 8.9–10.3)
Chloride: 103 mmol/L (ref 98–111)
Creatinine, Ser: 0.8 mg/dL (ref 0.44–1.00)
GFR, Estimated: >60 mL/min (ref >60)
Glucose, Bld: 84 mg/dL (ref 70–99)
Potassium: 3.7 mmol/L (ref 3.5–5.1)
Sodium: 137 mmol/L (ref 135–145)
Total Bilirubin: 0.5 mg/dL (ref 0.0–1.2)
Total Protein: 8.1 g/dL (ref 6.5–8.1)

## 2023-07-22 MED ORDER — LIDOCAINE-EPINEPHRINE (PF) 1 %-1:200000 IJ SOLN
20.0000 mL | Freq: Once | INTRAMUSCULAR | Status: AC
  Administered 2023-07-22: 20 mL
  Filled 2023-07-22: qty 30

## 2023-07-22 MED ORDER — METRONIDAZOLE 500 MG PO TABS: 500.0000 mg | ORAL_TABLET | Freq: Three times a day (TID) | ORAL | 0 refills | Status: AC

## 2023-07-22 MED ORDER — LIDOCAINE-EPINEPHRINE-TETRACAINE (LET) TOPICAL GEL
3.0000 mL | Freq: Once | TOPICAL | Status: AC
  Administered 2023-07-22: 3 mL via TOPICAL
  Filled 2023-07-22: qty 3

## 2023-07-22 MED ORDER — FENTANYL CITRATE PF 50 MCG/ML IJ SOSY
100.0000 ug | PREFILLED_SYRINGE | Freq: Once | INTRAMUSCULAR | Status: AC
  Administered 2023-07-22: 100 ug via INTRAVENOUS
  Filled 2023-07-22: qty 2

## 2023-07-22 MED ORDER — ONDANSETRON HCL 4 MG/2ML IJ SOLN
4.0000 mg | Freq: Once | INTRAMUSCULAR | Status: AC
  Administered 2023-07-22: 4 mg via INTRAVENOUS
  Filled 2023-07-22: qty 2

## 2023-07-22 MED ORDER — SODIUM CHLORIDE 0.9 % IV SOLN
1.0000 g | Freq: Once | INTRAVENOUS | Status: AC
  Administered 2023-07-22: 1 g via INTRAVENOUS
  Filled 2023-07-22: qty 10

## 2023-07-22 MED ORDER — DOXYCYCLINE HYCLATE 100 MG PO CAPS
100.0000 mg | ORAL_CAPSULE | Freq: Two times a day (BID) | ORAL | 0 refills | Status: DC
Start: 2023-07-22 — End: 2023-08-31

## 2023-07-22 NOTE — ED Notes (Signed)
 Left arm also has wound, edematous and reddened, some are open. Track mark noted on left forearm.

## 2023-07-22 NOTE — ED Triage Notes (Addendum)
 Pt comes with right foot pain and infection. Pt states she has been on meds for this since Thursday. Pt states she  thinks something bit her. Pt has large area noted to top of foot , bandage in place, redness noted around area. Pt states clear drainage and no odor.

## 2023-07-22 NOTE — ED Notes (Signed)
 Patient tolerated LET well. Patient is eating [employer] with her boyfriend.

## 2023-07-22 NOTE — Discharge Instructions (Signed)
 Warm Epsom salt soaks at least once per day.  Change the bandage daily and if it is saturated with drainage or if it gets wet or dirty.  If you are not improving over the next 2 to 3 days, see your primary care provider or return to the emergency department.

## 2023-07-22 NOTE — ED Notes (Signed)
 Waiting for IV team for access. Patient and family are aware.

## 2023-07-22 NOTE — ED Provider Notes (Signed)
 [name]    Event Date/Time   First MD Initiated Contact with Patient 07/22/23 1712     (approximate)   History   Foot Pain   HPI  Vicki Whitehead is a 38 y.o. female with history of intravenous drug use, bipolar disorder, DVT, anemia, asthma, hypertension and as listed in EMR presents to the emergency department for treatment and evaluation of right foot pain and swelling.  She started Augmentin  5 days ago.  The redness in the foot has improved somewhat however there is a large swollen area with 2 open wounds that drained clear fluid.  She states that this is from trimming [place] bushes and she was bitten by insects several times.  She denies fever.     Physical Exam    Vitals:   07/22/23 1546  BP: 121/84  Pulse: (!) 108  Resp: 18  Temp: 98 F (36.7 C)  SpO2: 100%    General: Awake, no distress.  CV:  Good peripheral perfusion.  Resp:  Normal effort.  Abd:  No distention.  Other:  Dorsal aspect of right foot with large area of induration and fluctuance with 2 areas of necrotic tissue.  No active drainage noted.   ED Results / Procedures / Treatments   Labs (all labs ordered are listed, but only abnormal results are displayed)  Labs Reviewed  COMPREHENSIVE METABOLIC PANEL WITH GFR - Abnormal; Notable for the following components:      Result Value   Albumin 3.0 (*)    Alkaline Phosphatase 157 (*)    All other components within normal limits  CBC WITH DIFFERENTIAL/PLATELET - Abnormal; Notable for the following components:   RBC 3.65 (*)    Hemoglobin 9.3 (*)    HCT 28.7 (*)    MCV 78.6 (*)    MCH 25.5 (*)    All other components within normal limits  CULTURE, BLOOD (ROUTINE X 2)  CULTURE, BLOOD (ROUTINE X 2)     EKG  Not indicated   RADIOLOGY  Image and radiology report reviewed and interpreted by me. Radiology report consistent with the same.  Not indicated  PROCEDURES:  Critical Care performed:  No  .Incision and Drainage  Date/Time: 07/22/2023 8:21 PM  Performed by: Herlinda Kirk NOVAK, FNP Authorized by: Herlinda Kirk NOVAK, FNP   Consent:    Consent obtained:  Verbal   Consent given by:  Patient   Risks discussed:  Bleeding, incomplete drainage and pain Location:    Type:  Abscess   Location:  Lower extremity   Lower extremity location:  Foot   Foot location:  R foot Pre-procedure details:    Skin preparation:  Povidone-iodine Anesthesia:    Anesthesia method:  Topical application and local infiltration   Topical anesthetic:  LET   Local anesthetic:  Lidocaine  1% WITH epi Procedure type:    Complexity:  Complex Procedure details:    Incision types:  Single straight   Incision depth:  Dermal   Wound management:  Probed and deloculated   Drainage:  Bloody and purulent   Drainage amount:  Scant   Wound treatment:  Wound left open   Packing materials:  None Post-procedure details:    Procedure completion:  Tolerated well, no immediate complications    MEDICATIONS ORDERED IN ED:  Medications  lidocaine -EPINEPHrine  (PF) (XYLOCAINE -EPINEPHrine ) 1 %-1:200000 (PF) injection 20 mL (has no administration in time range)  lidocaine -EPINEPHrine -tetracaine  (LET) topical gel (3 mLs Topical Given 07/22/23 1902)  cefTRIAXone  (ROCEPHIN )  1 g in sodium chloride  0.9 % 100 mL IVPB (1 g Intravenous New Bag/Given 07/22/23 1859)  fentaNYL  (SUBLIMAZE ) injection 100 mcg (100 mcg Intravenous Given 07/22/23 1856)  ondansetron  (ZOFRAN ) injection 4 mg (4 mg Intravenous Given 07/22/23 1853)     IMPRESSION / MDM / ASSESSMENT AND PLAN / ED COURSE   I have reviewed the triage note and vital signs. Vital signs stable with mild tachycardia at 108   Differential diagnosis includes, but is not limited to, cellulitis, abscess  Patient's presentation is most consistent with acute illness / injury with system symptoms.  38 year old female presenting to the emergency department for treatment and  evaluation of pain and swelling to the dorsal aspect of the right foot.  See HPI for further details.  On exam, she does have an indurated and fluctuant lesion will benefit from incision and drainage.  Patient is extremely anxious about this procedure.  Plan is to establish an IV, provide pain control, apply let prior to anesthetization with lidocaine  with epinephrine  and incision and drainage.  IV Rocephin  also ordered.  Patient is reluctant but agrees to procedure.  Family/friends at bedside who are encouraging her to proceed with treatment.  Mild anemia with a hemoglobin of 9.3 and hematocrit of 28.7 with history of the same.  CMP shows an albumin of 3.0 and an alkaline phosphatase of 157 but otherwise normal.  Incision and drainage was performed as described above.  Home care was discussed with the patient and family/friends.  She is to soak in warm Epsom salt at least once per day.  Bandage is to be changed daily or if it becomes saturated, dirty, or wet.  If not improving over the next 2 days, she is to see primary care or return to the emergency department.  Blood cultures are pending.  She will be prescribed doxycycline  and Flagyl  for broader coverage.  Medication teaching completed.     FINAL CLINICAL IMPRESSION(S) / ED DIAGNOSES   Final diagnoses:  Abscess of right foot     Rx / DC Orders   ED Discharge Orders          Ordered    doxycycline  (VIBRAMYCIN ) 100 MG capsule  2 times daily        07/22/23 1958    metroNIDAZOLE  (FLAGYL ) 500 MG tablet  3 times daily        07/22/23 1958             Note:  This document was prepared using Dragon voice recognition software and may include unintentional dictation errors.   Herlinda Kirk NOVAK, FNP 07/22/23 2025    Clarine Ozell LABOR, MD 07/23/23 2352

## 2023-07-27 LAB — CULTURE, BLOOD (ROUTINE X 2): Culture: NO GROWTH

## 2023-07-30 ENCOUNTER — Ambulatory Visit: Admitting: Family Medicine

## 2023-07-30 ENCOUNTER — Ambulatory Visit
Admission: RE | Admit: 2023-07-30 | Discharge: 2023-07-30 | Disposition: A | Discharge status: Home / Self Care | Attending: Family Medicine | Admitting: Family Medicine

## 2023-07-30 ENCOUNTER — Ambulatory Visit
Admission: RE | Admit: 2023-07-30 | Discharge: 2023-07-30 | Disposition: A | Discharge status: Home / Self Care | Source: Ambulatory Visit | Attending: Family Medicine | Admitting: Family Medicine

## 2023-07-30 ENCOUNTER — Other Ambulatory Visit: Payer: Self-pay | Admitting: Medical Genetics

## 2023-07-30 ENCOUNTER — Encounter: Payer: Self-pay | Admitting: Family Medicine

## 2023-07-30 VITALS — BP 117/78 | HR 85 | Temp 98.4°F | Resp 20 | Ht 71.0 in | Wt 197.0 lb

## 2023-07-30 DIAGNOSIS — F1910 Other psychoactive substance abuse, uncomplicated: Secondary | ICD-10-CM

## 2023-07-30 DIAGNOSIS — D509 Iron deficiency anemia, unspecified: Secondary | ICD-10-CM

## 2023-07-30 DIAGNOSIS — J4520 Mild intermittent asthma, uncomplicated: Secondary | ICD-10-CM

## 2023-07-30 DIAGNOSIS — G40909 Epilepsy, unspecified, not intractable, without status epilepticus: Secondary | ICD-10-CM

## 2023-07-30 DIAGNOSIS — F319 Bipolar disorder, unspecified: Secondary | ICD-10-CM

## 2023-07-30 DIAGNOSIS — Z8701 Personal history of pneumonia (recurrent): Secondary | ICD-10-CM

## 2023-07-30 DIAGNOSIS — G6289 Other specified polyneuropathies: Secondary | ICD-10-CM

## 2023-07-30 MED ORDER — GABAPENTIN 300 MG PO CAPS: 300.0000 mg | ORAL_CAPSULE | Freq: Three times a day (TID) | ORAL | 3 refills | Status: DC

## 2023-07-30 MED ORDER — QUETIAPINE FUMARATE 50 MG PO TABS: 50.0000 mg | ORAL_TABLET | Freq: Two times a day (BID) | ORAL | 1 refills | Status: DC

## 2023-07-30 MED ORDER — CLONIDINE HCL ER 0.1 MG PO TB12: 0.1000 mg | ORAL_TABLET | Freq: Two times a day (BID) | ORAL | 1 refills | Status: AC

## 2023-07-30 MED ORDER — PAROXETINE HCL 20 MG PO TABS
20.0000 mg | ORAL_TABLET | Freq: Every day | ORAL | 1 refills | Status: AC
Start: 2023-07-30 — End: ?

## 2023-07-30 MED ORDER — FLUTICASONE-SALMETEROL 100-50 MCG/ACT IN AEPB: 1.0000 | INHALATION_SPRAY | Freq: Two times a day (BID) | RESPIRATORY_TRACT | 3 refills | Status: AC

## 2023-07-30 NOTE — Progress Notes (Signed)
 Established Patient Office Visit  Subjective   Patient ID: Vicki Whitehead, female    DOB: 1985-09-04  Age: 38 y.o. MRN: [id number]  Chief Complaint  Patient presents with   Establish Care    HPI Discussed the use of AI scribe software for clinical note transcription with the patient, who gave verbal consent to proceed.  History of Present Illness   Vicki Whitehead is a 38 year old female who presents for a referral to a psychiatrist and management of her chronic pain and anxiety. She is accompanied by her mother and sister.  She seeks psychiatric referral for anxiety and substance abuse management. She has a history of opiate (fentanyl ) and meth, with relapses, but is currently abstinent. She is interested in treatments like Suboxone or Sublocade. Current medications include clonidine  0.1 mg twice daily for blood pressure and anxiety, Seroquel  50 mg at night for anxiety and sleep, and Paxil  20 mg daily for anxiety.  She experiences chronic pain from cervical disc disease, causing persistent neck pain, and has not undergone surgery. She also has left-sided sciatica and neuropathy in her feet and hands. Gabapentin  was prescribed but not filled due to side effects of feeling 'loopy'.  She has a history of infrequent seizures and has not been on specific seizure medication. She underwent an EEG in April 2025 at [facility] following a seizure episode. She has taken medication for bipolar disorder that may have provided some benefit.  Her asthma has been more active recently. She uses albuterol  twice a week but lacks a controlling medication. She smokes half a pack of cigarettes daily, having started at age 32. Alcohol consumption is rare, with the last instance three weeks ago during a camping trip.  She has anemia with recent labs showing low hematocrit and red blood cell counts, and is not on iron supplements. She had pneumonia about a month and a half ago, affecting her sense of smell and  taste, with no recent chest x-ray.  She sustained a recent foot injury from an insect bite while cutting down trees, resulting in swelling, erythema and cellulitis.  redness. She is still on doxycycline  and has been soaking her foot in Epsom salts.  She lives with her mother and sister following her husband's passing and works on a farm.         ROS    Objective:     BP 117/78 (BP Location: Right Arm, Patient Position: Sitting)   Pulse 85   Temp 98.4 F (36.9 C) (Oral)   Resp 20   Ht 5' 11 (1.803 m)   Wt 197 lb (89.4 kg)   LMP 07/20/2023   SpO2 96%   BMI 27.48 kg/m    Physical Exam Vitals and nursing note reviewed.  Constitutional:      Appearance: Normal appearance.  HENT:     Head: Normocephalic and atraumatic.  Eyes:     Conjunctiva/sclera: Conjunctivae normal.  Cardiovascular:     Rate and Rhythm: Normal rate and regular rhythm.  Pulmonary:     Effort: Pulmonary effort is normal.     Breath sounds: Normal breath sounds.  Musculoskeletal:     Right lower leg: No edema.     Left lower leg: No edema.       Feet:  Feet:     Comments: Two ulcerated areas on dorsum of left foot.  Area of edema and vascular congestion, purple hue.   Skin:    General: Skin is warm and dry.  Neurological:     Mental Status: She is alert and oriented to person, place, and time.  Psychiatric:        Mood and Affect: Mood normal.        Behavior: Behavior normal.        Thought Content: Thought content normal.        Judgment: Judgment normal.          No results found for any visits on 07/30/23.    The ASCVD Risk score (Arnett DK, et al., 2019) failed to calculate for the following reasons:   The 2019 ASCVD risk score is only valid for ages 56 to 42   Risk score cannot be calculated because patient has a medical history suggesting prior/existing ASCVD    Assessment & Plan:  Bipolar 1 disorder (HCC) -     cloNIDine  HCl ER; Take 1 tablet (0.1 mg total) by mouth 2  (two) times daily.  Dispense: 180 tablet; Refill: 1 -     PARoxetine  HCl; Take 1 tablet (20 mg total) by mouth daily.  Dispense: 90 tablet; Refill: 1 -     QUEtiapine  Fumarate; Take 1 tablet (50 mg total) by mouth 2 (two) times daily.  Dispense: 180 tablet; Refill: 1  Substance abuse (HCC) -     Ambulatory referral to Psychiatry  Iron deficiency anemia, unspecified iron deficiency anemia type -     Iron, TIBC and Ferritin Panel -     CBC with Differential/Platelet  Seizure disorder (HCC) -     Ambulatory referral to Neurology  Other polyneuropathy -     Gabapentin ; Take 1 capsule (300 mg total) by mouth 3 (three) times daily.  Dispense: 90 capsule; Refill: 3  Mild intermittent asthma without complication -     Fluticasone -Salmeterol; Inhale 1 puff into the lungs 2 (two) times daily.  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  She is seeking treatment options such as Suboxone or Sublocade. Requires referral to a specialist licensed to prescribe Suboxone. - Refer to a psychiatrist or specialist with a Suboxone license for treatment options.  Cervical Disc Disease Chronic neck pain due to cervical disc disease. No history of neck surgery. Pain management is a concern. Suboxone or Sublocade may help with pain management as well.  Seizure Disorder Seizures occur infrequently. Has never taken seizure medication. Recent EEG performed in April 2025 at [facility], [place], [place]. No access to those records currently. Referral to a neurologist is planned for further evaluation and management. - Refer to a neurologist for further evaluation and management of seizures.  Foot Infection Recent foot infection following an insect bite. Hospitalized approximately 1.5 weeks ago. Currently on doxycycline . Foot shows swelling and discoloration but no active infection. Advised on wound care including Epsom salt  soaks and avoiding hydrogen peroxide. - Instruct on daily soaking of foot in warm Epsom salt baths for 15 minutes. - Advise against using hydrogen peroxide on the wound. - Continue doxycycline  as prescribed. - Provide nonstick gauze for dressing changes with slight pressure through an ace wrap to reduce swelling.  Asthma Asthma exacerbation with increased use of albuterol  inhaler twice a week. No current controller medication. History of smoking since age 17, currently smoking half a pack per day. Smoking cessation is advised to improve asthma control. - Advair 100/50 I inhalation twice daily and rinse mouth after use.    Hypertension and Anxiety  Currently managed with clonidine  0.1 mg twice daily. Reports high anxiety levels. Also on Seroquel  and Paxil  for anxiety and sleep issues. Adjustment of medication may be needed to better control anxiety.   - Defer to psychiatry  Neuropathy and Sciatica Neuropathy in hands and feet, sciatica on the left side. Previous prescription for gabapentin  not filled. Gabapentin  is prescribed to manage symptoms. - Prescribe gabapentin  300 mg three times a day.  Anemia Anemia with recent lab results showing low hematocrit and red blood cells. Not currently taking iron supplements. Blood work is needed to assess current anemia status. - Order blood work to assess current anemia status.  Loss of Smell and Taste Loss of smell and taste following pneumonia approximately 1.5 months ago. A chest x-ray is needed to ensure resolution of pneumonia. - Order chest x-ray to ensure resolution of pneumonia.  General Health Maintenance Expired Implanon since 2015. No recent gynecological exams or Pap smears. Removal of expired Implanon and scheduling of a Pap smear are recommended. - Refer for removal of expired Implanon. - Recommend scheduling a Pap smear.      Return if symptoms worsen or fail to improve.  Please schedule a physical exam.    Vicki Whitehead K Corrine Tillis, MD

## 2023-07-30 NOTE — Progress Notes (Signed)
 Vicki Whitehead 07/30/2023  [facility] called pt to complete 90 day follow up call since last hospital admission when pt was seen by Addiction Medicine team.   Called [phone], no answer LVM. [facility] will attempt follow up again

## 2023-08-05 ENCOUNTER — Other Ambulatory Visit

## 2023-08-06 ENCOUNTER — Ambulatory Visit: Payer: Self-pay | Admitting: Family Medicine

## 2023-08-07 ENCOUNTER — Encounter: Payer: Self-pay | Admitting: Family Medicine

## 2023-08-27 ENCOUNTER — Telehealth: Payer: Self-pay

## 2023-08-27 ENCOUNTER — Emergency Department

## 2023-08-27 ENCOUNTER — Encounter: Admitting: Family Medicine

## 2023-08-27 ENCOUNTER — Other Ambulatory Visit: Payer: Self-pay

## 2023-08-27 ENCOUNTER — Encounter: Payer: Self-pay | Admitting: Emergency Medicine

## 2023-08-27 ENCOUNTER — Inpatient Hospital Stay
Admission: EM | Admit: 2023-08-27 | Discharge: 2023-08-31 | DRG: 872 | Disposition: A | Discharge status: Home / Self Care | Attending: Internal Medicine | Admitting: Internal Medicine

## 2023-08-27 DIAGNOSIS — Z825 Family history of asthma and other chronic lower respiratory diseases: Secondary | ICD-10-CM | POA: Diagnosis not present

## 2023-08-27 DIAGNOSIS — F17210 Nicotine dependence, cigarettes, uncomplicated: Secondary | ICD-10-CM | POA: Diagnosis present

## 2023-08-27 DIAGNOSIS — F32A Depression, unspecified: Secondary | ICD-10-CM | POA: Diagnosis not present

## 2023-08-27 DIAGNOSIS — I10 Essential (primary) hypertension: Secondary | ICD-10-CM

## 2023-08-27 DIAGNOSIS — L02413 Cutaneous abscess of right upper limb: Secondary | ICD-10-CM

## 2023-08-27 DIAGNOSIS — E878 Other disorders of electrolyte and fluid balance, not elsewhere classified: Secondary | ICD-10-CM | POA: Diagnosis present

## 2023-08-27 DIAGNOSIS — L97829 Non-pressure chronic ulcer of other part of left lower leg with unspecified severity: Secondary | ICD-10-CM | POA: Diagnosis present

## 2023-08-27 DIAGNOSIS — F419 Anxiety disorder, unspecified: Secondary | ICD-10-CM

## 2023-08-27 DIAGNOSIS — Z823 Family history of stroke: Secondary | ICD-10-CM

## 2023-08-27 DIAGNOSIS — G6289 Other specified polyneuropathies: Secondary | ICD-10-CM | POA: Diagnosis not present

## 2023-08-27 DIAGNOSIS — A419 Sepsis, unspecified organism: Secondary | ICD-10-CM | POA: Diagnosis not present

## 2023-08-27 DIAGNOSIS — N179 Acute kidney failure, unspecified: Secondary | ICD-10-CM | POA: Diagnosis present

## 2023-08-27 DIAGNOSIS — F1910 Other psychoactive substance abuse, uncomplicated: Secondary | ICD-10-CM

## 2023-08-27 DIAGNOSIS — Z7951 Long term (current) use of inhaled steroids: Secondary | ICD-10-CM

## 2023-08-27 DIAGNOSIS — Z86718 Personal history of other venous thrombosis and embolism: Secondary | ICD-10-CM

## 2023-08-27 DIAGNOSIS — Z8249 Family history of ischemic heart disease and other diseases of the circulatory system: Secondary | ICD-10-CM | POA: Diagnosis not present

## 2023-08-27 DIAGNOSIS — L02416 Cutaneous abscess of left lower limb: Secondary | ICD-10-CM | POA: Diagnosis present

## 2023-08-27 DIAGNOSIS — Z79899 Other long term (current) drug therapy: Secondary | ICD-10-CM

## 2023-08-27 DIAGNOSIS — L02414 Cutaneous abscess of left upper limb: Secondary | ICD-10-CM | POA: Diagnosis present

## 2023-08-27 DIAGNOSIS — Z818 Family history of other mental and behavioral disorders: Secondary | ICD-10-CM

## 2023-08-27 DIAGNOSIS — L03116 Cellulitis of left lower limb: Secondary | ICD-10-CM | POA: Diagnosis present

## 2023-08-27 DIAGNOSIS — F1110 Opioid abuse, uncomplicated: Secondary | ICD-10-CM | POA: Diagnosis present

## 2023-08-27 DIAGNOSIS — Z8673 Personal history of transient ischemic attack (TIA), and cerebral infarction without residual deficits: Secondary | ICD-10-CM

## 2023-08-27 DIAGNOSIS — Z8261 Family history of arthritis: Secondary | ICD-10-CM

## 2023-08-27 DIAGNOSIS — E871 Hypo-osmolality and hyponatremia: Secondary | ICD-10-CM | POA: Diagnosis present

## 2023-08-27 DIAGNOSIS — B954 Other streptococcus as the cause of diseases classified elsewhere: Secondary | ICD-10-CM | POA: Diagnosis present

## 2023-08-27 DIAGNOSIS — J4489 Other specified chronic obstructive pulmonary disease: Secondary | ICD-10-CM

## 2023-08-27 DIAGNOSIS — L02415 Cutaneous abscess of right lower limb: Secondary | ICD-10-CM | POA: Diagnosis not present

## 2023-08-27 DIAGNOSIS — G629 Polyneuropathy, unspecified: Secondary | ICD-10-CM | POA: Diagnosis present

## 2023-08-27 DIAGNOSIS — D509 Iron deficiency anemia, unspecified: Secondary | ICD-10-CM | POA: Diagnosis present

## 2023-08-27 DIAGNOSIS — L0390 Cellulitis, unspecified: Secondary | ICD-10-CM | POA: Diagnosis not present

## 2023-08-27 DIAGNOSIS — D75839 Thrombocytosis, unspecified: Secondary | ICD-10-CM | POA: Diagnosis present

## 2023-08-27 DIAGNOSIS — L03115 Cellulitis of right lower limb: Secondary | ICD-10-CM | POA: Diagnosis present

## 2023-08-27 DIAGNOSIS — L03119 Cellulitis of unspecified part of limb: Secondary | ICD-10-CM

## 2023-08-27 DIAGNOSIS — F1990 Other psychoactive substance use, unspecified, uncomplicated: Secondary | ICD-10-CM

## 2023-08-27 DIAGNOSIS — L02419 Cutaneous abscess of limb, unspecified: Secondary | ICD-10-CM | POA: Diagnosis not present

## 2023-08-27 DIAGNOSIS — Z5986 Financial insecurity: Secondary | ICD-10-CM

## 2023-08-27 DIAGNOSIS — B9689 Other specified bacterial agents as the cause of diseases classified elsewhere: Secondary | ICD-10-CM | POA: Diagnosis not present

## 2023-08-27 LAB — COMPREHENSIVE METABOLIC PANEL WITH GFR
ALT: 11 U/L (ref 0–44)
AST: 24 U/L (ref 15–41)
Albumin: 2.9 g/dL — ABNORMAL LOW (ref 3.5–5.0)
Alkaline Phosphatase: 103 U/L (ref 38–126)
Anion gap: 12 (ref 5–15)
BUN: 8 mg/dL (ref 6–20)
CO2: 24 mmol/L (ref 22–32)
Calcium: 9 mg/dL (ref 8.9–10.3)
Chloride: 97 mmol/L — ABNORMAL LOW (ref 98–111)
Creatinine, Ser: 1.08 mg/dL — ABNORMAL HIGH (ref 0.44–1.00)
GFR, Estimated: >60 mL/min (ref >60)
Glucose, Bld: 116 mg/dL — ABNORMAL HIGH (ref 70–99)
Potassium: 4.1 mmol/L (ref 3.5–5.1)
Sodium: 133 mmol/L — ABNORMAL LOW (ref 135–145)
Total Bilirubin: 0.4 mg/dL (ref 0.0–1.2)
Total Protein: 8.5 g/dL — ABNORMAL HIGH (ref 6.5–8.1)

## 2023-08-27 LAB — CBC WITH DIFFERENTIAL/PLATELET
Abs Immature Granulocytes: 0.14 K/uL — ABNORMAL HIGH (ref 0.00–0.07)
Basophils Absolute: 0.1 K/uL (ref 0.0–0.1)
Basophils Relative: 0 %
Eosinophils Absolute: 0.1 K/uL (ref 0.0–0.5)
Eosinophils Relative: 1 %
HCT: 29.2 % — ABNORMAL LOW (ref 36.0–46.0)
Hemoglobin: 9.2 g/dL — ABNORMAL LOW (ref 12.0–15.0)
Immature Granulocytes: 1 %
Lymphocytes Relative: 16 %
Lymphs Abs: 2.3 K/uL (ref 0.7–4.0)
MCH: 24.9 pg — ABNORMAL LOW (ref 26.0–34.0)
MCHC: 31.5 g/dL (ref 30.0–36.0)
MCV: 78.9 fL — ABNORMAL LOW (ref 80.0–100.0)
Monocytes Absolute: 0.7 K/uL (ref 0.1–1.0)
Monocytes Relative: 5 %
Neutro Abs: 11.3 K/uL — ABNORMAL HIGH (ref 1.7–7.7)
Neutrophils Relative %: 77 %
Platelets: 628 K/uL — ABNORMAL HIGH (ref 150–400)
RBC: 3.7 MIL/uL — ABNORMAL LOW (ref 3.87–5.11)
RDW: 16.7 % — ABNORMAL HIGH (ref 11.5–15.5)
WBC: 14.6 K/uL — ABNORMAL HIGH (ref 4.0–10.5)
nRBC: 0 % (ref 0.0–0.2)

## 2023-08-27 LAB — HCG, QUANTITATIVE, PREGNANCY: hCG, Beta Chain, Quant, S: 1 m[IU]/mL (ref <5)

## 2023-08-27 LAB — PROTIME-INR
INR: 1 (ref 0.8–1.2)
Prothrombin Time: 14.1 s (ref 11.4–15.2)

## 2023-08-27 LAB — LACTIC ACID, PLASMA: Lactic Acid, Venous: 1.9 mmol/L (ref 0.5–1.9)

## 2023-08-27 MED ORDER — TRAZODONE HCL 50 MG PO TABS: 25.0000 mg | ORAL_TABLET | Freq: Every evening | ORAL | Status: DC | PRN

## 2023-08-27 MED ORDER — ALBUTEROL SULFATE (2.5 MG/3ML) 0.083% IN NEBU: 2.5000 mg | INHALATION_SOLUTION | Freq: Four times a day (QID) | RESPIRATORY_TRACT | Status: DC | PRN

## 2023-08-27 MED ORDER — CLONIDINE HCL ER 0.1 MG PO TB12
0.1000 mg | ORAL_TABLET | Freq: Two times a day (BID) | ORAL | Status: DC
  Administered 2023-08-27 – 2023-08-28 (×2): 0.1 mg via ORAL
  Filled 2023-08-27 (×2): qty 1

## 2023-08-27 MED ORDER — ONDANSETRON HCL 4 MG/2ML IJ SOLN: 4.0000 mg | Freq: Four times a day (QID) | INTRAMUSCULAR | Status: DC | PRN

## 2023-08-27 MED ORDER — SODIUM CHLORIDE 0.9 % IV BOLUS
500.0000 mL | Freq: Once | INTRAVENOUS | Status: AC
  Administered 2023-08-27: 500 mL via INTRAVENOUS

## 2023-08-27 MED ORDER — VANCOMYCIN HCL IN DEXTROSE 1-5 GM/200ML-% IV SOLN: 1000.0000 mg | Freq: Once | INTRAVENOUS | Status: DC

## 2023-08-27 MED ORDER — VANCOMYCIN HCL 1000 MG/200ML IV SOLN
1000.0000 mg | Freq: Two times a day (BID) | INTRAVENOUS | Status: DC
  Administered 2023-08-28 – 2023-08-31 (×7): 1000 mg via INTRAVENOUS
  Filled 2023-08-27 (×7): qty 200

## 2023-08-27 MED ORDER — QUETIAPINE FUMARATE 25 MG PO TABS
50.0000 mg | ORAL_TABLET | Freq: Two times a day (BID) | ORAL | Status: DC
  Administered 2023-08-27 – 2023-08-31 (×8): 50 mg via ORAL
  Filled 2023-08-27 (×8): qty 2

## 2023-08-27 MED ORDER — ENOXAPARIN SODIUM 40 MG/0.4ML IJ SOSY
40.0000 mg | PREFILLED_SYRINGE | INTRAMUSCULAR | Status: DC
  Administered 2023-08-27 – 2023-08-30 (×4): 40 mg via SUBCUTANEOUS
  Filled 2023-08-27 (×4): qty 0.4

## 2023-08-27 MED ORDER — SODIUM CHLORIDE 0.9 % IV SOLN: INTRAVENOUS | Status: AC

## 2023-08-27 MED ORDER — KETOROLAC TROMETHAMINE 15 MG/ML IJ SOLN
15.0000 mg | Freq: Four times a day (QID) | INTRAMUSCULAR | Status: DC | PRN
  Administered 2023-08-29 – 2023-08-31 (×2): 15 mg via INTRAVENOUS
  Filled 2023-08-27 (×2): qty 1

## 2023-08-27 MED ORDER — HYDROXYZINE HCL 25 MG PO TABS: 25.0000 mg | ORAL_TABLET | Freq: Three times a day (TID) | ORAL | Status: DC | PRN

## 2023-08-27 MED ORDER — GABAPENTIN 300 MG PO CAPS
300.0000 mg | ORAL_CAPSULE | Freq: Three times a day (TID) | ORAL | Status: DC
  Administered 2023-08-27 – 2023-08-31 (×11): 300 mg via ORAL
  Filled 2023-08-27 (×11): qty 1

## 2023-08-27 MED ORDER — MAGNESIUM HYDROXIDE 400 MG/5ML PO SUSP
30.0000 mL | Freq: Every day | ORAL | Status: DC | PRN
  Administered 2023-08-28 – 2023-08-29 (×2): 30 mL via ORAL
  Filled 2023-08-27 (×2): qty 30

## 2023-08-27 MED ORDER — SODIUM CHLORIDE 0.9 % IV BOLUS: 500.0000 mL | Freq: Once | INTRAVENOUS | Status: DC

## 2023-08-27 MED ORDER — ACETAMINOPHEN 325 MG PO TABS: 650.0000 mg | ORAL_TABLET | Freq: Four times a day (QID) | ORAL | Status: DC | PRN

## 2023-08-27 MED ORDER — VANCOMYCIN HCL 1750 MG/350ML IV SOLN
1750.0000 mg | Freq: Once | INTRAVENOUS | Status: AC
  Administered 2023-08-27: 1750 mg via INTRAVENOUS
  Filled 2023-08-27: qty 350

## 2023-08-27 MED ORDER — PIPERACILLIN-TAZOBACTAM 3.375 G IVPB 30 MIN
3.3750 g | Freq: Once | INTRAVENOUS | Status: AC
  Administered 2023-08-27: 3.375 g via INTRAVENOUS
  Filled 2023-08-27: qty 50

## 2023-08-27 MED ORDER — ACETAMINOPHEN 650 MG RE SUPP: 650.0000 mg | Freq: Four times a day (QID) | RECTAL | Status: DC | PRN

## 2023-08-27 MED ORDER — ONDANSETRON HCL 4 MG PO TABS: 4.0000 mg | ORAL_TABLET | Freq: Four times a day (QID) | ORAL | Status: DC | PRN

## 2023-08-27 MED ORDER — BUPRENORPHINE HCL-NALOXONE HCL 8-2 MG SL SUBL
1.0000 | SUBLINGUAL_TABLET | Freq: Two times a day (BID) | SUBLINGUAL | Status: DC
  Administered 2023-08-27 – 2023-08-31 (×8): 1 via SUBLINGUAL
  Filled 2023-08-27 (×8): qty 1

## 2023-08-27 NOTE — Assessment & Plan Note (Signed)
 Last needle use was about 2 weeks ago. Counseling was provided - Will continue Suboxone .

## 2023-08-27 NOTE — ED Triage Notes (Signed)
 Patient to ED via POV for abscess to left lower leg. Ongoing x1 month. States it ruptured on route to ED. Drainage noted and redness. Denies currently drug use- last used 2 weeks ago.   Also has multiple wounds to left arm with drainage.

## 2023-08-27 NOTE — Telephone Encounter (Signed)
 Patient arrived for visit though we had called and sent messages to reschedule since provider is not in the office. She could nt stand and had mother speak to CMA about how bad her symptoms are. She now has wounds on arms and feet and swelling so severe she can hardly walk. Advised ED as she may need IV Abx. They will go to ED right now per Mother.

## 2023-08-27 NOTE — ED Provider Notes (Signed)
 [facility] Provider Note    Event Date/Time   First MD Initiated Contact with Patient 08/27/23 1656     (approximate)   History   Abscess  Patient to ED via POV for abscess to left lower leg. Ongoing x1 month. States it ruptured on route to ED. Drainage noted and redness. Denies currently drug use- last used 2 weeks ago.   Also has multiple wounds to left arm with drainage.    HPI Vicki Whitehead is a 38 y.o. female PMH bipolar disorder, history of intravenous drug use, asthma, prior DVT presents for evaluation of multiple abscesses - Patient is primarily here today because she had worsening of her right lower extremity cellulitis despite being on doxycycline  for the past 4 days.  Has developed a blister over her left leg that is now draining.  Separately has multiple abscesses primarily on her left upper extremity that remain grossly purulent.  No known recent fevers. - Endorses history of IVDU, last use about 3 weeks ago, currently on Suboxone  - Accompanied by family who provides collateral       Physical Exam   Triage Vital Signs: ED Triage Vitals  Encounter Vitals Group     BP 08/27/23 1548 123/70     Girls Systolic BP Percentile --      Girls Diastolic BP Percentile --      Boys Systolic BP Percentile --      Boys Diastolic BP Percentile --      Pulse Rate 08/27/23 1548 92     Resp 08/27/23 1548 18     Temp 08/27/23 1548 98.5 F (36.9 C)     Temp Source 08/27/23 1548 Oral     SpO2 08/27/23 1548 99 %     Weight 08/27/23 1550 185 lb (83.9 kg)     Height 08/27/23 1550 5' 11 (1.803 m)     Head Circumference --      Peak Flow --      Pain Score 08/27/23 1549 8     Pain Loc --      Pain Education --      Exclude from Growth Chart --     Most recent vital signs: Vitals:   08/27/23 1548 08/27/23 1655  BP: 123/70   Pulse: 92   Resp: 18   Temp: 98.5 F (36.9 C)   SpO2: 99% 100%     General: Awake, no distress.  CV:  Good  peripheral perfusion. RRR, RP 2+ Resp:  Normal effort. CTAB Other:  Multiple scattered abscesses to bilateral upper extremities, spontaneously draining.  A few on left upper extremity with surrounding erythema. LLE:  Notable erythema to shin with overlying blister draining serous fluid.  Warm to touch, tender.  No clear fluctuance, bedside ultrasound with diffuse cobblestoning though no underlying fluid pocket to suggest abscess.   ED Results / Procedures / Treatments   Labs (all labs ordered are listed, but only abnormal results are displayed) Labs Reviewed  COMPREHENSIVE METABOLIC PANEL WITH GFR - Abnormal; Notable for the following components:      Result Value   Sodium 133 (*)    Chloride 97 (*)    Glucose, Bld 116 (*)    Creatinine, Ser 1.08 (*)    Total Protein 8.5 (*)    Albumin 2.9 (*)    All other components within normal limits  CBC WITH DIFFERENTIAL/PLATELET - Abnormal; Notable for the following components:   WBC 14.6 (*)    RBC 3.70 (*)  Hemoglobin 9.2 (*)    HCT 29.2 (*)    MCV 78.9 (*)    MCH 24.9 (*)    RDW 16.7 (*)    Platelets 628 (*)    Neutro Abs 11.3 (*)    Abs Immature Granulocytes 0.14 (*)    All other components within normal limits  CULTURE, BLOOD (ROUTINE X 2)  CULTURE, BLOOD (ROUTINE X 2)  AEROBIC CULTURE W GRAM STAIN (SUPERFICIAL SPECIMEN)  AEROBIC CULTURE W GRAM STAIN (SUPERFICIAL SPECIMEN)  AEROBIC CULTURE W GRAM STAIN (SUPERFICIAL SPECIMEN)  LACTIC ACID, PLASMA  PROTIME-INR  HCG, QUANTITATIVE, PREGNANCY  CREATININE, SERUM  POC URINE PREG, ED     EKG  N/a   RADIOLOGY Radiology interpreted by myself and radiology report reviewed.  No acute pathology identified.    PROCEDURES:  Critical Care performed: No  Procedures   MEDICATIONS ORDERED IN ED: Medications  vancomycin  (VANCOREADY) IVPB 1750 mg/350 mL (1,750 mg Intravenous New Bag/Given 08/27/23 1949)  vancomycin  (VANCOREADY) IVPB 1000 mg/200 mL (has no administration in  time range)  sodium chloride  0.9 % bolus 500 mL (500 mLs Intravenous New Bag/Given 08/27/23 1851)  piperacillin -tazobactam (ZOSYN ) IVPB 3.375 g (3.375 g Intravenous New Bag/Given 08/27/23 1851)     IMPRESSION / MDM / ASSESSMENT AND PLAN / ED COURSE  I reviewed the triage vital signs and the nursing notes.                              DDX/MDM/AP: Differential diagnosis includes, but is not limited to, multiple cutaneous abscesses without evidence of surrounding cellulitis --fortunately all abscesses appear to be draining spontaneously.  Does have a notable obvious cellulitis to her left lower extremity, bedside ultrasound with no underlying fluid pocket, not clinically concern for underlying abscess at this time but I am concerned about her having worsening cellulitis despite already being on doxycycline .  She also has no history of injection at the site on her left lower extremity and no clear prior injury-consider possibility of bacteremia in the setting of IVDU and multiple bilateral upper extremity skin/soft tissue infections, consider possibility of underlying endocarditis.  Consider underlying osteomyelitis at site of wounds.  Plan: - Labs - IV antibiotics - X-ray left forearm, left humerus, left tib-fib - Anticipate admission  Patient's presentation is most consistent with acute presentation with potential threat to life or bodily function.  The patient is on the cardiac monitor to evaluate for evidence of arrhythmia and/or significant heart rate changes.  ED course below.  Workup with leukocytosis to about 15 with left shift, also with mild AKI, given IV fluid and treated with broad-spectrum antibiotics of vancomycin  and Zosyn .  Blood cultures collected prior.  Admitted to hospitalist service for further treatment and management.  Clinical Course as of 08/27/23 2019  Wed Aug 27, 2023  1835 CBC with leukocytosis to about 15 as well as left shift [MM]  1840 XR L tib  fib: IMPRESSION: 1. No acute osseous abnormality. 2. Diffuse subcutaneous edema. 3. Ulceration of the skin of the anterior distal shin.   [MM]  1849 XR L forearm, humerus: IMPRESSION: 1. No acute osseous abnormality. No radiographic evidence of osteomyelitis. 2. Soft tissue ulceration of the forearm.   [MM]  1849 XR R humerus: IMPRESSION: Soft tissue swelling and air in the lateral mid arm soft tissues. No foreign body.   [MM]  1859 CMP with mild bump in creatinine, will give IV fluid [MM]  1929  Hcg neg [MM]  1931 Hospitalist consult order placed [MM]    Clinical Course User Index [MM] Clarine Ozell LABOR, MD     FINAL CLINICAL IMPRESSION(S) / ED DIAGNOSES   Final diagnoses:  Left leg cellulitis  Abscess of left arm  Abscess of right arm     Rx / DC Orders   ED Discharge Orders     None        Note:  This document was prepared using Dragon voice recognition software and may include unintentional dictation errors.   Clarine Ozell LABOR, MD 08/27/23 2019

## 2023-08-27 NOTE — H&P (Signed)
 Vicki Whitehead   PATIENT NAME: Vicki Whitehead    MR#:  [id number]  DATE OF BIRTH:  08-Sep-1985  DATE OF ADMISSION:  08/27/2023  PRIMARY CARE PHYSICIAN: Ziglar, Susan K, MD   Patient is coming from: Home  REQUESTING/REFERRING PHYSICIAN: Clarine Sharper, MD   CHIEF COMPLAINT:   Chief Complaint  Patient presents with   Abscess    HISTORY OF PRESENT ILLNESS:  Vicki Whitehead is a 38 y.o. Caucasian female with medical history significant for anxiety, depression, asthma, COPD, hypertension, CVA, seizure and substance abuse including IV fentanyl , currently on Suboxone  for the last 3 weeks, who presented to the emergency room with acute onset of worsening left leg cellulitis with purulent discharge.  She was seen on outpatient basis and has been given p.o. doxycycline  in the recent past.  She had a leftover prescription that she started taking over the last 4 days without improvement however.  She has been having worsening abscesses in the right upper extremity in addition to the left leg.  She has been having periodic fever and occasional chills.  Last night her Tmax was 99.  She denies any vomiting however has been nauseated.  She denies any diarrhea or melena or protruding per rectum.  No dysuria, oliguria or hematuria or flank pain.  No cough or wheezing or dyspnea.  No chest pain or palpitations.  ED Course: When the patient came to the ER, vital signs were within normal.  Labs revealed mild hyponatremia and hypochloremia and low albumin of 2.9.  CBC showed leukocytosis of 14.6 with neutrophilia.  It showed anemia close to baseline with microcytosis and thrombocytosis of 628.  Blood cultures were drawn.  Wound Gram stain and culture and sensitivity was taken. EKG as reviewed by me : None Imaging: Left forearm x-ray showed soft tissue ulceration with no acute osseous abnormality left humerus x-ray showed the same right humerus x-ray showed the same with no foreign body with soft tissue swelling  and air in the lateral mid arm soft tissues.  Left tibia and fibula x-ray showed diffuse subcutaneous edema and ulceration of the skin of the anterior distal shin with no acute osseous abnormality.  The patient was given IV Zosyn  and vancomycin  in addition to 500 mL IV normal saline bolus.  She will be admitted to a medical telemetry bed for further evaluation and management.SABRA PAST MEDICAL HISTORY:   Past Medical History:  Diagnosis Date   Anemia    Anxiety    Asthma    Constipation    COPD (chronic obstructive pulmonary disease) (HCC)    Depression    Hypertension    Sciatica    Seizures (HCC)    Stroke (HCC)    Substance abuse (HCC)     PAST SURGICAL HISTORY:   Past Surgical History:  Procedure Laterality Date   CESAREAN SECTION     TEE WITHOUT CARDIOVERSION N/A 10/13/2013   Procedure: TRANSESOPHAGEAL ECHOCARDIOGRAM (TEE);  Surgeon: Vinie KYM Maxcy, MD;  Location: [facility] ENDOSCOPY;  Service: Cardiovascular;  Laterality: N/A;   TONSILLECTOMY      SOCIAL HISTORY:   Social History   Tobacco Use   Smoking status: Every Day    Current packs/day: 0.50    Average packs/day: 0.5 packs/day for 15.0 years (7.5 ttl pk-yrs)    Types: Cigarettes    Passive exposure: Current   Smokeless tobacco: Never  Substance Use Topics   Alcohol use: Not Currently    Comment: rarely    FAMILY  HISTORY:   Family History  Problem Relation Age of Onset   Anxiety disorder Mother    Arthritis Mother    COPD Mother    Depression Mother    Alcohol abuse Maternal Grandfather    Drug abuse Maternal Grandfather    Asthma Maternal Grandmother    ADD / ADHD Sister    Anxiety disorder Sister    Cancer Sister    Depression Sister    Heart disease Sister    Stroke Sister     DRUG ALLERGIES:   Allergies  Allergen Reactions   Pneumococcal Vaccine Other (See Comments)    cellulitis    REVIEW OF SYSTEMS:   ROS As per history of present illness. All pertinent systems were reviewed above.  Constitutional, HEENT, cardiovascular, respiratory, GI, GU, musculoskeletal, neuro, psychiatric, endocrine, integumentary and hematologic systems were reviewed and are otherwise negative/unremarkable except for positive findings mentioned above in the HPI.   MEDICATIONS AT HOME:   Prior to Admission medications   Medication Sig Start Date End Date Taking? Authorizing Provider  albuterol  (VENTOLIN  HFA) 108 (90 Base) MCG/ACT inhaler Inhale 2 puffs into the lungs every 6 (six) hours as needed for wheezing or shortness of breath. 06/05/23  Yes Fausto Sor A, DO  cloNIDine  HCl (KAPVAY ) 0.1 MG TB12 ER tablet Take 1 tablet (0.1 mg total) by mouth 2 (two) times daily. 07/30/23  Yes Ziglar, Susan K, MD  fluticasone -salmeterol (ADVAIR) 100-50 MCG/ACT AEPB Inhale 1 puff into the lungs 2 (two) times daily. 07/30/23  Yes Ziglar, Susan K, MD  gabapentin  (NEURONTIN ) 300 MG capsule Take 1 capsule (300 mg total) by mouth 3 (three) times daily. 07/30/23  Yes Ziglar, Susan K, MD  hydrOXYzine  (ATARAX ) 25 MG tablet Take 25 mg by mouth 3 (three) times daily as needed. 08/21/23  Yes [provider]  ondansetron  (ZOFRAN -ODT) 8 MG disintegrating tablet Take 8 mg by mouth every 8 (eight) hours. 08/21/23  Yes [provider]  PARoxetine  (PAXIL ) 20 MG tablet Take 1 tablet (20 mg total) by mouth daily. 07/30/23  Yes Ziglar, Susan K, MD  QUEtiapine  (SEROQUEL ) 50 MG tablet Take 1 tablet (50 mg total) by mouth 2 (two) times daily. 07/30/23  Yes Ziglar, Susan K, MD  SUBOXONE  8-2 MG FILM Place under the tongue 2 (two) times daily. 08/23/23  Yes [provider]  acetaminophen  (TYLENOL ) 500 MG tablet Take 500 mg by mouth every 6 (six) hours as needed for headache.  Patient not taking: Reported on 08/27/2023    [provider]  budesonide -formoterol  (SYMBICORT ) 80-4.5 MCG/ACT inhaler Inhale 2 puffs into the lungs 2 (two) times daily. Patient not taking: Reported on 07/30/2023 06/05/23   Fausto Sor A,  DO  doxycycline  (VIBRAMYCIN ) 100 MG capsule Take 1 capsule (100 mg total) by mouth 2 (two) times daily. Patient not taking: Reported on 08/27/2023 07/22/23   Herlinda Belton B, FNP  medroxyPROGESTERone  (DEPO-PROVERA ) 150 MG/ML injection Inject 150 mg into the muscle every 3 (three) months. Patient not taking: Reported on 08/27/2023    [provider]      VITAL SIGNS:  Blood pressure 123/70, pulse 92, temperature 98.5 F (36.9 C), temperature source Oral, resp. rate 18, height 5' 11 (1.803 m), weight 83.9 kg, SpO2 100%.  PHYSICAL EXAMINATION:  Physical Exam  GENERAL:  38 y.o.-year-old Caucasian female Caucasian patient lying in the bed with no acute distress.  EYES: Pupils equal, round, reactive to light and accommodation. No scleral icterus. Extraocular muscles intact.  HEENT: Head atraumatic,  normocephalic. Oropharynx and nasopharynx clear.  NECK:  Supple, no jugular venous distention. No thyroid enlargement, no tenderness.  LUNGS: Normal breath sounds bilaterally, no wheezing, rales,rhonchi or crepitation. No use of accessory muscles of respiration.  CARDIOVASCULAR: Regular rate and rhythm, S1, S2 normal. No murmurs, rubs, or gallops.  ABDOMEN: Soft, nondistended, nontender. Bowel sounds present. No organomegaly or mass.  EXTREMITIES: No pedal edema, cyanosis, or clubbing.  NEUROLOGIC: Cranial nerves II through XII are intact. Muscle strength 5/5 in all extremities. Sensation intact. Gait not checked.  PSYCHIATRIC: The patient is alert and oriented x 3.  Normal affect and good eye contact. SKIN: As shown in pictures: - Right arm erythema and open wound and scattered abscesses over the right forearm with erythema and induration as well as tenderness.  - Left elbow and posterior forearm abscesses with purulent drainage with surrounding erythema and cellulitis with tenderness.   -Left anterior forearm and arm abscesses with purulent drainage of the arm and tendon.   -Left leg  diffuse swelling with erythema and draining purulence from her shin wound with tenderness and fluctuation.         LABORATORY PANEL:   CBC Recent Labs  Lab 08/27/23 1722  WBC 14.6*  HGB 9.2*  HCT 29.2*  PLT 628*   ------------------------------------------------------------------------------------------------------------------  Chemistries  Recent Labs  Lab 08/27/23 1722  NA 133*  K 4.1  CL 97*  CO2 24  GLUCOSE 116*  BUN 8  CREATININE 1.08*  CALCIUM 9.0  AST 24  ALT 11  ALKPHOS 103  BILITOT 0.4   ------------------------------------------------------------------------------------------------------------------  Cardiac Enzymes No results for input(s): TROPONINI in the last 168 hours. ------------------------------------------------------------------------------------------------------------------  RADIOLOGY:  DG Forearm Left Result Date: 08/27/2023 CLINICAL DATA:  Skin infection.  Evaluate for osteomyelitis. EXAM: LEFT HUMERUS - 2+ VIEW; LEFT FOREARM - 2 VIEW COMPARISON:  None Available. FINDINGS: No acute fracture or dislocation. The bones are well mineralized. No arthritic changes. No periosteal elevation or bone erosion. Implantable contraceptive in the soft tissues of the arm. A 5 mm linear density in the soft tissues of the anterior elbow noted. There is ulceration of the skin of the forearm. Small pocket of soft tissue gas in the dorsum of the forearm may be within the skin. IMPRESSION: 1. No acute osseous abnormality. No radiographic evidence of osteomyelitis. 2. Soft tissue ulceration of the forearm. Electronically Signed   By: Vanetta Chou M.D.   On: 08/27/2023 18:40   DG Humerus Left Result Date: 08/27/2023 CLINICAL DATA:  Skin infection.  Evaluate for osteomyelitis. EXAM: LEFT HUMERUS - 2+ VIEW; LEFT FOREARM - 2 VIEW COMPARISON:  None Available. FINDINGS: No acute fracture or dislocation. The bones are well mineralized. No arthritic changes. No  periosteal elevation or bone erosion. Implantable contraceptive in the soft tissues of the arm. A 5 mm linear density in the soft tissues of the anterior elbow noted. There is ulceration of the skin of the forearm. Small pocket of soft tissue gas in the dorsum of the forearm may be within the skin. IMPRESSION: 1. No acute osseous abnormality. No radiographic evidence of osteomyelitis. 2. Soft tissue ulceration of the forearm. Electronically Signed   By: Vanetta Chou M.D.   On: 08/27/2023 18:40   DG Humerus Right Result Date: 08/27/2023 CLINICAL DATA:  Soft tissue infection EXAM: RIGHT HUMERUS - 2+ VIEW COMPARISON:  None Available. FINDINGS: There is no evidence of fracture or other focal bone lesions. There is soft tissue swelling and air in the lateral mid arm  soft tissues. No foreign body. IMPRESSION: Soft tissue swelling and air in the lateral mid arm soft tissues. No foreign body. Electronically Signed   By: Greig Pique M.D.   On: 08/27/2023 18:39   DG Tibia/Fibula Left Result Date: 08/27/2023 CLINICAL DATA:  Skin infection. EXAM: LEFT TIBIA AND FIBULA - 2 VIEW COMPARISON:  Radiograph dated 06/04/2023. FINDINGS: No acute fracture or dislocation. The bones are well mineralized. No periosteal elevation or bone erosion. Chronic calcification adjacent to the medial malleolus. There is diffuse subcutaneous edema. There is ulceration of the skin of the anterior distal shin. No radiopaque foreign object or subcu gas. IMPRESSION: 1. No acute osseous abnormality. 2. Diffuse subcutaneous edema. 3. Ulceration of the skin of the anterior distal shin. Electronically Signed   By: Vanetta Chou M.D.   On: 08/27/2023 18:37      IMPRESSION AND PLAN:  Assessment and Plan: * Cellulitis and abscess of left lower extremity - The patient will be admitted to a medical telemetry bed. - Will continue antibiotic therapy with IV vancomycin  and Zosyn . - Wound compresses will be utilized. - ID and general surgery  consult will be obtained. - I notified Dr. Fayette and Dr. Lane about the patient.  Cellulitis and abscess of upper extremity - Management as above.  Sepsis due to cellulitis (HCC) - Sepsis is manifested by leukocytosis and heart rate of 92. - The patient will be hydrated with IV normal saline. - Will continue antibiotic therapy as mentioned above with IV Zosyn  and vancomycin . - Will follow cultures and wound Gram stain culture and sensitivity.  IV drug abuse (HCC) - Will continue Suboxone .  Anxiety and depression - Will continue Paxil  and Seroquel .  Asthma with COPD (HCC) - Will continue her inhalers.  Essential hypertension - Will continue antihypertensive therapy.  Peripheral neuropathy - Will continue Neurontin .   DVT prophylaxis: Lovenox .  Advanced Care Planning:  Code Status: full code.  Family Communication:  The plan of care was discussed in details with the patient (and family). I answered all questions. The patient agreed to proceed with the above mentioned plan. Further management will depend upon hospital course. Disposition Plan: Back to previous home environment Consults called: General Surgery and ID All the records are reviewed and case discussed with ED provider.  Status is: Inpatient  At the time of the admission, it appears that the appropriate admission status for this patient is inpatient.  This is judged to be reasonable and necessary in order to provide the required intensity of service to ensure the patient's safety given the presenting symptoms, physical exam findings and initial radiographic and laboratory data in the context of comorbid conditions.  The patient requires inpatient status due to high intensity of service, high risk of further deterioration and high frequency of surveillance required.  I certify that at the time of admission, it is my clinical judgment that the patient will require inpatient hospital care extending more than 2  midnights.                            Dispo: The patient is from: Home              Anticipated d/c is to: Home              Patient currently is not medically stable to d/c.              Difficult to place patient: No  Lumir Demetriou  DELENA Peaches M.D on 08/27/2023 at 9:44 PM  [facility]   From 7 PM-7 AM, contact night-coverage [email or web address]  CC: Primary care physician; Ziglar, Susan K, MD

## 2023-08-27 NOTE — Assessment & Plan Note (Signed)
 Patient has multiple abscesses with surrounding cellulitis involving the left lower extremity and bilateral upper extremities, Some of them are spontaneously draining, no obvious need for any incision and drainage per general surgery.  Preliminary blood cultures negative, one of the wound culture growing gram-positive cocci and gram-negative rods. Secondary to IV drug use as her symptoms started while she was using IV drugs - Continue with Zosyn  and vancomycin  -Continue with supportive care -Follow-up cultures

## 2023-08-27 NOTE — Telephone Encounter (Signed)
 Left VM requesting a return call in order to reschedule appointment as well as sent Mychart message. Provider out of office.

## 2023-08-27 NOTE — Assessment & Plan Note (Signed)
-   Will continue Neurontin .

## 2023-08-27 NOTE — Assessment & Plan Note (Signed)
-   Will continue antihypertensive therapy.

## 2023-08-27 NOTE — Consult Note (Signed)
 Pharmacy Antibiotic Note  ASSESSMENT: 38 y.o. female with PMH including IVDU, chronic pain, seizures is presenting with left lower abscess, concerning for cellulitis, patient endorses it ruptured en route to ED. There is erythema and purulence. Pharmacy has been consulted to manage vancomycin  dosing. Patient's renal function is at upper end of baseline.  PLAN: Administer vancomycin  1750mg  IV x 1 as a loading dose followed by 1000mg  IV q12H thereafter eAUC 470, Cmax 29, Cmin 13.3 Scr 1.08, IBW, Vd 0.72 L/kg Follow up culture results to assess for antibiotic optimization. Monitor renal function to assess for any necessary antibiotic dosing changes.  Patient measurements: Height: 5' 11 (180.3 cm) Weight: 83.9 kg (185 lb) IBW/kg (Calculated) : 70.8  Vital signs: Temp: 98.5 F (36.9 C) (08/20 1548) Temp Source: Oral (08/20 1548) BP: 123/70 (08/20 1548) Pulse Rate: 92 (08/20 1548) Recent Labs  Lab 08/27/23 1722  WBC 14.6*   CrCl cannot be calculated (Patient's most recent lab result is older than the maximum 21 days allowed.).  Allergies: Allergies  Allergen Reactions   Pneumococcal Vaccine Other (See Comments)    cellulitis    Antimicrobials this admission: Zosyn  8/20 x 1 Vancomycin  8/20 >>  Dose adjustments this admission: N/A  Microbiology results: 8/20 BCx: sent  Thank you for allowing pharmacy to be a part of this patient's care.  Will M. Lenon, PharmD Clinical Pharmacist 08/27/2023 6:21 PM

## 2023-08-27 NOTE — ED Notes (Signed)
 Phlebotomy unable to draw blood on patient

## 2023-08-27 NOTE — Assessment & Plan Note (Signed)
 Management as above

## 2023-08-27 NOTE — ED Provider Triage Note (Cosign Needed Addendum)
 Emergency Medicine Provider Triage Evaluation Note  Marcell Pfeifer , a 38 y.o. female  was evaluated in triage.  Pt complains of abscess.  Patient is here with her mother.  Symptoms started a month ago with 2 lesions in the left arm, 2 in the forearm, 1 in the left leg, and 1 in the right arm.  Active purulent drainage.  Was seeing a month ago blood cultures were negative. Patient has history of IV drug user.  Patient is taking doxycycline , day 4  Review of Systems  Positive:  Negative:   Physical Exam  BP 123/70 (BP Location: Right Arm)   Pulse 92   Temp 98.5 F (36.9 C) (Oral)   Resp 18   Ht 5' 11 (1.803 m)   Wt 83.9 kg   SpO2 99%   BMI 25.80 kg/m during triage vital signs were normal Gen:   Awake, no distress febrile Resp:  Normal effort  MSK:   Moves extremities without difficulty  Other:  Left arm presence of 2 circular lesions with active white drooling secretions.  Left forearm with a lesion.  Left leg with hematoma and purulent drainage.  Right arm presence of lesion  Medical Decision Making  Medically screening exam initiated at 3:56 PM.  Appropriate orders placed.  Jenisse Vullo was informed that the remainder of the evaluation will be completed by another provider, this initial triage assessment does not replace that evaluation, and the importance of remaining in the ED until their evaluation is complete.  Patient presents today with history of months of multiple abscesses with active draining.  Patient is afebrile during triage, vital signs are normal.  Ordered CBC, CMP, blood culture, lactic acid fluid due to the use of IV recreational drugs.  Last used 2 weeks ago.  Patient is in for day 4 of doxycycline  according to her mother   Janit Kast, DEVONNA 08/27/23 1559    Janit Kast, PA-C 08/27/23 1601

## 2023-08-27 NOTE — Assessment & Plan Note (Signed)
 Patient met sepsis criteria with leukocytosis and borderline tachycardia.  Secondary to multiple abscesses with IV drug use. - Management as above

## 2023-08-27 NOTE — Assessment & Plan Note (Signed)
-   Will continue her inhalers.

## 2023-08-27 NOTE — Assessment & Plan Note (Signed)
-   Will continue Paxil  and Seroquel .

## 2023-08-27 NOTE — ED Notes (Signed)
 Pt advised lab drew her blood in triage. I called lab to confirm same and they said they would check and let me know.

## 2023-08-28 DIAGNOSIS — L02414 Cutaneous abscess of left upper limb: Secondary | ICD-10-CM | POA: Diagnosis not present

## 2023-08-28 DIAGNOSIS — L02415 Cutaneous abscess of right lower limb: Secondary | ICD-10-CM

## 2023-08-28 DIAGNOSIS — L03119 Cellulitis of unspecified part of limb: Secondary | ICD-10-CM | POA: Diagnosis not present

## 2023-08-28 DIAGNOSIS — L03116 Cellulitis of left lower limb: Principal | ICD-10-CM

## 2023-08-28 DIAGNOSIS — L02413 Cutaneous abscess of right upper limb: Secondary | ICD-10-CM

## 2023-08-28 DIAGNOSIS — L02416 Cutaneous abscess of left lower limb: Secondary | ICD-10-CM | POA: Diagnosis not present

## 2023-08-28 DIAGNOSIS — G6289 Other specified polyneuropathies: Secondary | ICD-10-CM

## 2023-08-28 DIAGNOSIS — F1910 Other psychoactive substance abuse, uncomplicated: Secondary | ICD-10-CM | POA: Diagnosis not present

## 2023-08-28 DIAGNOSIS — L0390 Cellulitis, unspecified: Secondary | ICD-10-CM | POA: Diagnosis not present

## 2023-08-28 DIAGNOSIS — B9689 Other specified bacterial agents as the cause of diseases classified elsewhere: Secondary | ICD-10-CM

## 2023-08-28 LAB — CBC
HCT: 27.3 % — ABNORMAL LOW (ref 36.0–46.0)
Hemoglobin: 8.8 g/dL — ABNORMAL LOW (ref 12.0–15.0)
MCH: 25.7 pg — ABNORMAL LOW (ref 26.0–34.0)
MCHC: 32.2 g/dL (ref 30.0–36.0)
MCV: 79.6 fL — ABNORMAL LOW (ref 80.0–100.0)
Platelets: 609 K/uL — ABNORMAL HIGH (ref 150–400)
RBC: 3.43 MIL/uL — ABNORMAL LOW (ref 3.87–5.11)
RDW: 16.5 % — ABNORMAL HIGH (ref 11.5–15.5)
WBC: 7.1 K/uL (ref 4.0–10.5)
nRBC: 0 % (ref 0.0–0.2)

## 2023-08-28 LAB — BASIC METABOLIC PANEL WITH GFR
Anion gap: 10 (ref 5–15)
BUN: 7 mg/dL (ref 6–20)
CO2: 23 mmol/L (ref 22–32)
Calcium: 8.2 mg/dL — ABNORMAL LOW (ref 8.9–10.3)
Chloride: 106 mmol/L (ref 98–111)
Creatinine, Ser: 0.93 mg/dL (ref 0.44–1.00)
GFR, Estimated: >60 mL/min (ref >60)
Glucose, Bld: 98 mg/dL (ref 70–99)
Potassium: 4.2 mmol/L (ref 3.5–5.1)
Sodium: 139 mmol/L (ref 135–145)

## 2023-08-28 MED ORDER — SODIUM CHLORIDE 0.9 % IV SOLN: Freq: Once | INTRAVENOUS | Status: AC

## 2023-08-28 MED ORDER — CLONIDINE HCL ER 0.1 MG PO TB12
0.1000 mg | ORAL_TABLET | Freq: Every day | ORAL | Status: DC
  Administered 2023-08-29 – 2023-08-31 (×3): 0.1 mg via ORAL
  Filled 2023-08-28 (×3): qty 1

## 2023-08-28 MED ORDER — SODIUM CHLORIDE 0.9 % IV BOLUS: 50.0000 mL | Freq: Once | INTRAVENOUS | Status: DC

## 2023-08-28 MED ORDER — PIPERACILLIN-TAZOBACTAM 3.375 G IVPB
3.3750 g | Freq: Three times a day (TID) | INTRAVENOUS | Status: DC
  Administered 2023-08-28: 3.375 g via INTRAVENOUS
  Filled 2023-08-28: qty 50

## 2023-08-28 MED ORDER — PAROXETINE HCL 20 MG PO TABS
20.0000 mg | ORAL_TABLET | Freq: Every day | ORAL | Status: DC
  Administered 2023-08-28 – 2023-08-31 (×4): 20 mg via ORAL
  Filled 2023-08-28 (×6): qty 1

## 2023-08-28 MED ORDER — SODIUM CHLORIDE 0.9 % IV SOLN
2.0000 g | Freq: Three times a day (TID) | INTRAVENOUS | Status: DC
  Administered 2023-08-28 – 2023-08-31 (×8): 2 g via INTRAVENOUS
  Filled 2023-08-28 (×9): qty 12.5

## 2023-08-28 MED ORDER — SODIUM CHLORIDE 0.9 % IV BOLUS
500.0000 mL | Freq: Once | INTRAVENOUS | Status: AC
  Administered 2023-08-28: 500 mL via INTRAVENOUS

## 2023-08-28 MED ORDER — FLUTICASONE FUROATE-VILANTEROL 100-25 MCG/ACT IN AEPB
1.0000 | INHALATION_SPRAY | Freq: Every day | RESPIRATORY_TRACT | Status: DC
  Administered 2023-08-28 – 2023-08-31 (×4): 1 via RESPIRATORY_TRACT
  Filled 2023-08-28: qty 28

## 2023-08-28 NOTE — Progress Notes (Signed)
 Progress Note   Patient: Vicki Whitehead FMW:[id number] DOB: 1985/01/11 DOA: 08/27/2023     1 DOS: the patient was seen and examined on 08/28/2023   Brief hospital course: Partly taken from H&P.  Loys Mckinstry is a 38 y.o. Caucasian female with medical history significant for anxiety, depression, asthma, COPD, hypertension, CVA, seizure and substance abuse including IV fentanyl , currently on Suboxone  for the last 3 weeks, who presented to the emergency room with acute onset of worsening left leg cellulitis with purulent discharge.  She was seen on outpatient basis and has been given p.o. doxycycline  in the recent past.  She had a leftover prescription that she started taking over the last 4 days without improvement.  On presentation stable vitals, labs with mild hyponatremia and hypochloremia, albumin of 2.9, leukocytosis at 14.6, microcytic anemia close to baseline and thrombocytosis at 628. Blood cultures and wound cultures was sent.  Left forearm x-ray showed soft tissue ulceration with no acute osseous abnormality left humerus x-ray showed the same right humerus x-ray showed the same with no foreign body with soft tissue swelling and air in the lateral mid arm soft tissues.  Left tibia and fibula x-ray showed diffuse subcutaneous edema and ulceration of the skin of the anterior distal shin with no acute osseous abnormality.   ID and general surgery was consulted and patient was started on Zosyn  and vancomycin .  8/21: Vitals stable, leukocytosis resolved, hemoglobin decreased to 8.8 from 9.2-all cell lines decreased so likely some dilutional effect.  One of the wound cultures with rare gram-positive cocci in pairs-otherwise no growth in less than 12 hours.  Blood cultures pending. Most of her wounds/abscesses are spontaneously draining, no current need of any incision and drainage per general surgery.  Assessment and Plan: * Cellulitis and abscess of left lower extremity Patient has multiple  abscesses with surrounding cellulitis involving the left lower extremity and bilateral upper extremities, Some of them are spontaneously draining, no obvious need for any incision and drainage per general surgery.  Preliminary blood cultures negative, one of the wound culture growing gram-positive cocci and gram-negative rods. Secondary to IV drug use as her symptoms started while she was using IV drugs - Continue with Zosyn  and vancomycin  -Continue with supportive care -Follow-up cultures  Cellulitis and abscess of upper extremity - Management as above.  Sepsis due to cellulitis [facility]) Patient met sepsis criteria with leukocytosis and borderline tachycardia.  Secondary to multiple abscesses with IV drug use. - Management as above  IV drug abuse (HCC) Last needle use was about 2 weeks ago. Counseling was provided - Will continue Suboxone .  Anxiety and depression - Will continue Paxil  and Seroquel .  Asthma with COPD (HCC) - Will continue her inhalers.  Essential hypertension Blood pressure borderline soft -Decrease  the home dose of clonidine  to daily  Peripheral neuropathy - Will continue Neurontin .   Subjective: Patient was seen and examined today.  Pain seems controlled, continue to have drainage from multiple abscesses.  Last needle use was about 2 weeks ago, her symptoms started before stopping IV drug use.  Physical Exam: Vitals:   08/28/23 0643 08/28/23 0654 08/28/23 0843 08/28/23 1148  BP:  (!) 97/58 (!) 96/54 106/62  Pulse:  84 86 86  Resp:  17 16 16   Temp: 98.1 F (36.7 C)  98.2 F (36.8 C) 98.1 F (36.7 C)  TempSrc: Oral  Oral   SpO2:  99% 96% 96%  Weight:      Height:  General.  Well-developed lady, in no acute distress. Pulmonary.  Lungs clear bilaterally, normal respiratory effort. CV.  Regular rate and rhythm, no JVD, rub or murmur. Abdomen.  Soft, nontender, nondistended, BS positive. CNS.  Alert and oriented .  No focal neurologic  deficit. Extremities.  Multiple draining and nondraining areas with surrounding erythema and edema involving bilateral upper extremities and left lower extremity. Psychiatry.  Judgment and insight appears normal.   Data Reviewed: Prior data reviewed.  Family Communication: Discussed with patient  Disposition: Status is: Inpatient Remains inpatient appropriate because: Severity of illness  Planned Discharge Destination: Home  DVT prophylaxis.  Lovenox  Time spent: 50 minutes  This record has been created using Conservation officer, historic buildings. Errors have been sought and corrected,but may not always be located. Such creation errors do not reflect on the standard of care.   Author: Amaryllis Dare, MD 08/28/2023 2:13 PM  For on call review [email or web address].

## 2023-08-28 NOTE — Consult Note (Signed)
 [facility] SURGICAL CONSULTATION NOTE (initial) - cpt: 00746   HISTORY OF PRESENT ILLNESS (HPI):  38 y.o. female presented to [facility] ED yesterday for evaluation of abscesses. Patient reports around a 1 month history of on-going multiple areas of abscesses. Worse seems to be anterior LLE. She reports this has been erythematous and tender. Spontaneously drained prior to arrival. She also has multiple scattered areas on her left forearm and left upper arm. She has a history of similar and had left over doxycycline  at home. She tried to take this for about 4 days without improvements. 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 Work up in the ED revealed leukocytosis to 14.6K, Hgb to 9.2, sCr - 1.08, hyponatremia to 133, and Bcx are pending. Her wounds were swabbed x3 for culture and one with GPC and GNR; appears this is from left arm, not clear which wound. She is currently on Vancomycin . Also got dose of Zosyn .   Surgery is consulted by hospitalist physician Dr. Madison Peaches, MD in this context for evaluation and management of multiple areas of cellulitis and abscess.  PAST MEDICAL HISTORY (PMH):  Past Medical History:  Diagnosis Date   Anemia    Anxiety    Asthma    Constipation    COPD (chronic obstructive pulmonary disease) (HCC)    Depression    Hypertension    Sciatica    Seizures (HCC)    Stroke (HCC)    Substance abuse (HCC)      PAST SURGICAL HISTORY (PSH):  Past Surgical History:  Procedure Laterality Date   CESAREAN SECTION     TEE WITHOUT CARDIOVERSION N/A 10/13/2013   Procedure: TRANSESOPHAGEAL ECHOCARDIOGRAM (TEE);  Surgeon: Vinie KYM Maxcy, MD;  Location: [facility] ENDOSCOPY;  Service: Cardiovascular;  Laterality: N/A;   TONSILLECTOMY       MEDICATIONS:  Prior to Admission medications   Medication Sig Start Date End Date Taking? Authorizing Provider  albuterol  (VENTOLIN  HFA) 108 (90 Base) MCG/ACT inhaler Inhale 2 puffs into the lungs every 6  (six) hours as needed for wheezing or shortness of breath. 06/05/23  Yes Fausto Sor A, DO  cloNIDine  HCl (KAPVAY ) 0.1 MG TB12 ER tablet Take 1 tablet (0.1 mg total) by mouth 2 (two) times daily. 07/30/23  Yes Ziglar, Susan K, MD  fluticasone -salmeterol (ADVAIR) 100-50 MCG/ACT AEPB Inhale 1 puff into the lungs 2 (two) times daily. 07/30/23  Yes Ziglar, Susan K, MD  gabapentin  (NEURONTIN ) 300 MG capsule Take 1 capsule (300 mg total) by mouth 3 (three) times daily. 07/30/23  Yes Ziglar, Susan K, MD  hydrOXYzine  (ATARAX ) 25 MG tablet Take 25 mg by mouth 3 (three) times daily as needed. 08/21/23  Yes [provider]  ondansetron  (ZOFRAN -ODT) 8 MG disintegrating tablet Take 8 mg by mouth every 8 (eight) hours. 08/21/23  Yes [provider]  PARoxetine  (PAXIL ) 20 MG tablet Take 1 tablet (20 mg total) by mouth daily. 07/30/23  Yes Ziglar, Susan K, MD  QUEtiapine  (SEROQUEL ) 50 MG tablet Take 1 tablet (50 mg total) by mouth 2 (two) times daily. 07/30/23  Yes Ziglar, Susan K, MD  SUBOXONE  8-2 MG FILM Place under the tongue 2 (two) times daily. 08/23/23  Yes [provider]  acetaminophen  (TYLENOL ) 500 MG tablet Take 500 mg by mouth every 6 (six) hours as needed for headache.  Patient not taking: Reported on 08/27/2023    [provider]  budesonide -formoterol  (SYMBICORT ) 80-4.5 MCG/ACT inhaler Inhale 2 puffs into  the lungs 2 (two) times daily. Patient not taking: Reported on 07/30/2023 06/05/23   Fausto Sor A, DO  doxycycline  (VIBRAMYCIN ) 100 MG capsule Take 1 capsule (100 mg total) by mouth 2 (two) times daily. Patient not taking: Reported on 08/27/2023 07/22/23   Herlinda Belton B, FNP  medroxyPROGESTERone  (DEPO-PROVERA ) 150 MG/ML injection Inject 150 mg into the muscle every 3 (three) months. Patient not taking: Reported on 08/27/2023    [provider]     ALLERGIES:  Allergies  Allergen Reactions   Pneumococcal Vaccine Other (See Comments)    cellulitis      SOCIAL HISTORY:  Social History   Socioeconomic History   Marital status: Single    Spouse name: Not on file   Number of children: Not on file   Years of education: Not on file   Highest education level: Bachelor's degree (e.g., BA, AB, BS)  Occupational History   Not on file  Tobacco Use   Smoking status: Every Day    Current packs/day: 0.50    Average packs/day: 0.5 packs/day for 15.0 years (7.5 ttl pk-yrs)    Types: Cigarettes    Passive exposure: Current   Smokeless tobacco: Never  Substance and Sexual Activity   Alcohol use: Not Currently    Comment: rarely   Drug use: Not Currently    Types: Amphetamines, Fentanyl , Heroin, Methamphetamines   Sexual activity: Yes    Birth control/protection: Implant  Other Topics Concern   Not on file  Social History Narrative   Not on file   Social Drivers of Health   Financial Resource Strain: High Risk (07/28/2023)   Overall Financial Resource Strain (CARDIA)    Difficulty of Paying Living Expenses: Hard  Food Insecurity: No Food Insecurity (07/28/2023)   Hunger Vital Sign    Worried About Running Out of Food in the Last Year: Never true    Ran Out of Food in the Last Year: Never true  Transportation Needs: No Transportation Needs (07/28/2023)   PRAPARE - Administrator, Civil Service (Medical): No    Lack of Transportation (Non-Medical): No  Physical Activity: Insufficiently Active (07/28/2023)   Exercise Vital Sign    Days of Exercise per Week: 1 day    Minutes of Exercise per Session: 10 min  Stress: Stress Concern Present (07/28/2023)   [employer] of Occupational Health - Occupational Stress Questionnaire    Feeling of Stress: Very much  Social Connections: Moderately Isolated (07/28/2023)   Social Connection and Isolation Panel    Frequency of Communication with Friends and Family: More than three times a week    Frequency of Social Gatherings with Friends and Family: More than three times a week     Attends Religious Services: More than 4 times per year    Active Member of [employer] or Organizations: No    Attends Engineer, structural: Not on file    Marital Status: Never married  Intimate Partner Violence: Unknown (04/11/2021)   Received from [facility]   HITS    Physically Hurt: Not on file    Insult or Talk Down To: Not on file    Threaten Physical Harm: Not on file    Scream or Curse: Not on file     FAMILY HISTORY:  Family History  Problem Relation Age of Onset   Anxiety disorder Mother    Arthritis Mother    COPD Mother    Depression Mother    Alcohol abuse Maternal Grandfather  Drug abuse Maternal Grandfather    Asthma Maternal Grandmother    ADD / ADHD Sister    Anxiety disorder Sister    Cancer Sister    Depression Sister    Heart disease Sister    Stroke Sister       REVIEW OF SYSTEMS:  Review of Systems  Constitutional:  Negative for chills and fever.  Cardiovascular:  Negative for chest pain and palpitations.  Gastrointestinal:  Negative for nausea and vomiting.  Skin:  Negative for itching and rash.       + multiple abscesses  All other systems reviewed and are negative.   VITAL SIGNS:  Temp:  [98.1 F (36.7 C)-98.5 F (36.9 C)] 98.1 F (36.7 C) (08/21 0643) Pulse Rate:  [62-92] 84 (08/21 0654) Resp:  [17-19] 17 (08/21 0654) BP: (87-123)/(41-70) 97/58 (08/21 0654) SpO2:  [91 %-100 %] 99 % (08/21 0654) Weight:  [83.9 kg] 83.9 kg (08/20 1550)     Height: 5' 11 (180.3 cm) Weight: 83.9 kg BMI (Calculated): 25.81   INTAKE/OUTPUT:  No intake/output data recorded.  PHYSICAL EXAM:  Physical Exam Vitals and nursing note reviewed. Exam conducted with a chaperone present.  Constitutional:      General: She is not in acute distress.    Appearance: Normal appearance. She is not ill-appearing.     Comments: Resting in bed; NAD  HENT:     Head: Normocephalic and atraumatic.  Eyes:     General: No scleral icterus.    Conjunctiva/sclera:  Conjunctivae normal.  Cardiovascular:     Rate and Rhythm: Normal rate.     Pulses: Normal pulses.  Pulmonary:     Effort: Pulmonary effort is normal. No respiratory distress.  Genitourinary:    Comments: Deferred Skin:    General: Skin is warm and dry.     Findings: Abscess and erythema present.     Comments: Left Forearm: There are two areas one to the volar and dorsal aspect of the mid forearm with open wounds and surrounding erythema and tenderness, these are both spontaneously draining. There is also a small area or erythema and tenderness to the left elbow but this is without gross purulence.   Left Upper Arm: There is a small wound overlying the bicep, surrounding erythema, tender, no drainage or palpable purulence this morning.   Left Lower Leg: Significant erythema and tenderness to the anterior left lower leg, there is central wound with scant drainage and scabbing  Right Upper Extremity: Multiple scatter small areas of erythema, no drainage   Neurological:     General: No focal deficit present.     Mental Status: She is alert. Mental status is at baseline.  Psychiatric:        Mood and Affect: Mood normal.        Behavior: Behavior normal.      Labs:     Latest Ref Rng & Units 08/28/2023    6:16 AM 08/27/2023    5:22 PM 07/22/2023    6:42 PM  CBC  WBC 4.0 - 10.5 K/uL 7.1  14.6  9.0   Hemoglobin 12.0 - 15.0 g/dL 8.8  9.2  9.3   Hematocrit 36.0 - 46.0 % 27.3  29.2  28.7   Platelets 150 - 400 K/uL 609  628  315       Latest Ref Rng & Units 08/28/2023    6:16 AM 08/27/2023    5:22 PM 07/22/2023    6:42 PM  CMP  Glucose 70 -  99 mg/dL 98  883  84   BUN 6 - 20 mg/dL 7  8  15    Creatinine 0.44 - 1.00 mg/dL 9.06  8.91  9.19   Sodium 135 - 145 mmol/L 139  133  137   Potassium 3.5 - 5.1 mmol/L 4.2  4.1  3.7   Chloride 98 - 111 mmol/L 106  97  103   CO2 22 - 32 mmol/L 23  24  23    Calcium 8.9 - 10.3 mg/dL 8.2  9.0  9.0   Total Protein 6.5 - 8.1 g/dL  8.5  8.1   Total  Bilirubin 0.0 - 1.2 mg/dL  0.4  0.5   Alkaline Phos 38 - 126 U/L  103  157   AST 15 - 41 U/L  24  24   ALT 0 - 44 U/L  11  24      Imaging studies:  DG Forearm Left Final result 08/27/2023 6:21 PM      CLINICAL DATA:  Skin infection.  Evaluate for osteomyelitis.  EXAM: LEFT HUMERUS - 2+ VIEW; LEFT FOREARM - 2 VIEW  COMPARISON:  None Available.  FINDINGS: No acute fracture or dislocation. The bones are well mineralized. No arthritic changes. No periosteal elevation or bone erosion. ...     DG Humerus Left Final result 08/27/2023 6:21 PM      CLINICAL DATA:  Skin infection.  Evaluate for osteomyelitis.  EXAM: LEFT HUMERUS - 2+ VIEW; LEFT FOREARM - 2 VIEW  COMPARISON:  None Available.  FINDINGS: No acute fracture or dislocation. The bones are well mineralized. No arthritic changes. No periosteal elevation or bone erosion. ...     DG Humerus Right Final result 08/27/2023 6:21 PM      CLINICAL DATA:  Soft tissue infection  EXAM: RIGHT HUMERUS - 2+ VIEW  COMPARISON:  None Available.  FINDINGS: There is no evidence of fracture or other focal bone lesions. There is soft tissue swelling and air in the lateral mid arm soft tissues. ...    Assessment/Plan: 38 y.o. female with with multiple scatter areas of cellulitis and spontaneously draining abscesses to the LUE and LLE.   - Most concerning areas are abscess to the left forearm; however, these are already spontaneously draining. I do not think she needs urgent debridement at this time given this. Of course, should this change, we may need to consider orthopedic evaluation/management given these are below the elbow. Will allow time for continued treatment with IV Abx; suspect she will need at least 48 hours. Follow up Cx as available. Superficial dressing daily should be sufficient for now. Monitor leukocytosis. We will follow along.   All of the above findings and recommendations were discussed with the patient, and all of  patient's questions were answered to her expressed satisfaction.  Thank you for the opportunity to participate in this patient's care.   -- Arthea Platt, PA-C [facility] 08/28/2023, 8:19 AM M-F: 7am - 4pm

## 2023-08-28 NOTE — Plan of Care (Signed)
  Problem: Pain Managment: Goal: General experience of comfort will improve and/or be controlled Outcome: Progressing   Problem: Safety: Goal: Ability to remain free from injury will improve Outcome: Progressing   Problem: Skin Integrity: Goal: Risk for impaired skin integrity will decrease Outcome: Progressing

## 2023-08-28 NOTE — Consult Note (Signed)
 NAME: Vicki Whitehead  DOB: 03-18-1985  MRN: [id number]  Date/Time: 08/28/2023 9:03 PM  REQUESTING PROVIDER: Dr. Lawence Subjective:  REASON FOR CONSULT: Multiple abscesses Mom at bedside  Vicki Whitehead is a 38 y.o. female with a history of IVDA, COPD, hypertension presents with multiple abscesses.  She has had multiple abscesses in her extremities for the past 3 weeks but it is recently gotten really worse especially the left leg and left arm which is exuding pus She did not seek care.  She had a prescription of doxycycline  left from a previous infection in her toe and started taking it. On questioning she does skin pop sometimes.  She has no past history of MRSA She has no past history of endocarditis  08/27/23 15:48  BP 123/70  Temp 98.5 F (36.9 C)  Pulse Rate 92  Resp 18  SpO2 99 %    Latest Reference Range & Units 08/27/23 17:22  WBC 4.0 - 10.5 K/uL 14.6 (H)  Hemoglobin 12.0 - 15.0 g/dL 9.2 (L)  HCT 63.9 - 53.9 % 29.2 (L)  Platelets 150 - 400 K/uL 628 (H)  Creatinine 0.44 - 1.00 mg/dL 8.91 (H)   Blood culture sent She started on broad-spectrum antibiotics with vancomycin  and Zosyn  I am asked to see the patient for the same  in October 2015 she was hospitalized for candidemia in [place]. She had TEE which was negative for vegetation.  She was treated with 2 weeks of blood and resolved. She had a false positive HIV antibody HIV RNA was negative She had hep C antibody positive but negative viral load.  In November 2020 she was hospitalized at [facility] with abscesses and right upper extremity infection cellulitis and had Streptococcus pyogenes bacteremia and was seen by ID and treated there.  Past Medical History:  Diagnosis Date   Anemia    Anxiety    Asthma    Constipation    COPD (chronic obstructive pulmonary disease) (HCC)    Depression    Hypertension    Sciatica    Seizures (HCC)    Stroke (HCC)    Substance abuse (HCC)     Past Surgical History:   Procedure Laterality Date   CESAREAN SECTION     TEE WITHOUT CARDIOVERSION N/A 10/13/2013   Procedure: TRANSESOPHAGEAL ECHOCARDIOGRAM (TEE);  Surgeon: Vinie KYM Maxcy, MD;  Location: [facility] ENDOSCOPY;  Service: Cardiovascular;  Laterality: N/A;   TONSILLECTOMY      Social History   Socioeconomic History   Marital status: Single    Spouse name: Not on file   Number of children: Not on file   Years of education: Not on file   Highest education level: Bachelor's degree (e.g., BA, AB, BS)  Occupational History   Not on file  Tobacco Use   Smoking status: Every Day    Current packs/day: 0.50    Average packs/day: 0.5 packs/day for 15.0 years (7.5 ttl pk-yrs)    Types: Cigarettes    Passive exposure: Current   Smokeless tobacco: Never  Substance and Sexual Activity   Alcohol use: Not Currently    Comment: rarely   Drug use: Not Currently    Types: Amphetamines, Fentanyl , Heroin, Methamphetamines   Sexual activity: Yes    Birth control/protection: Implant  Other Topics Concern   Not on file  Social History Narrative   Not on file   Social Drivers of Health   Financial Resource Strain: High Risk (07/28/2023)   Overall Financial Resource Strain (CARDIA)  Difficulty of Paying Living Expenses: Hard  Food Insecurity: No Food Insecurity (08/28/2023)   Hunger Vital Sign    Worried About Running Out of Food in the Last Year: Never true    Ran Out of Food in the Last Year: Never true  Transportation Needs: No Transportation Needs (08/28/2023)   PRAPARE - Administrator, Civil Service (Medical): No    Lack of Transportation (Non-Medical): No  Physical Activity: Insufficiently Active (07/28/2023)   Exercise Vital Sign    Days of Exercise per Week: 1 day    Minutes of Exercise per Session: 10 min  Stress: Stress Concern Present (07/28/2023)   [employer] of Occupational Health - Occupational Stress Questionnaire    Feeling of Stress: Very much  Social Connections:  Moderately Isolated (07/28/2023)   Social Connection and Isolation Panel    Frequency of Communication with Friends and Family: More than three times a week    Frequency of Social Gatherings with Friends and Family: More than three times a week    Attends Religious Services: More than 4 times per year    Active Member of [employer] or Organizations: No    Attends Engineer, structural: Not on file    Marital Status: Never married  Intimate Partner Violence: Not At Risk (08/28/2023)   Humiliation, Afraid, Rape, and Kick questionnaire    Fear of Current or Ex-Partner: No    Emotionally Abused: No    Physically Abused: No    Sexually Abused: No    Family History  Problem Relation Age of Onset   Anxiety disorder Mother    Arthritis Mother    COPD Mother    Depression Mother    Alcohol abuse Maternal Grandfather    Drug abuse Maternal Grandfather    Asthma Maternal Grandmother    ADD / ADHD Sister    Anxiety disorder Sister    Cancer Sister    Depression Sister    Heart disease Sister    Stroke Sister    Allergies  Allergen Reactions   Pneumococcal Vaccine Other (See Comments)    cellulitis   I? Current Facility-Administered Medications  Medication Dose Route Frequency Provider Last Rate Last Admin   acetaminophen  (TYLENOL ) tablet 650 mg  650 mg Oral Q6H PRN Mansy, Jan A, MD       Or   acetaminophen  (TYLENOL ) suppository 650 mg  650 mg Rectal Q6H PRN Mansy, Jan A, MD       albuterol  (PROVENTIL ) (2.5 MG/3ML) 0.083% nebulizer solution 2.5 mg  2.5 mg Inhalation Q6H PRN Mansy, Jan A, MD       buprenorphine -naloxone  (SUBOXONE ) 8-2 mg per SL tablet 1 tablet  1 tablet Sublingual BID Mansy, Jan A, MD   1 tablet at 08/28/23 2055   ceFEPIme  (MAXIPIME ) 2 g in sodium chloride  0.9 % 100 mL IVPB  2 g Intravenous Q8H Equan Cogbill, MD 200 mL/hr at 08/28/23 2100 2 g at 08/28/23 2100   [START ON 08/29/2023] cloNIDine  HCl (KAPVAY ) ER tablet 0.1 mg  0.1 mg Oral Q0600 Amin, Sumayya, MD        enoxaparin  (LOVENOX ) injection 40 mg  40 mg Subcutaneous Q24H Mansy, Jan A, MD   40 mg at 08/28/23 2102   fluticasone  furoate-vilanterol (BREO ELLIPTA ) 100-25 MCG/ACT 1 puff  1 puff Inhalation Daily Amin, Sumayya, MD   1 puff at 08/28/23 1735   gabapentin  (NEURONTIN ) capsule 300 mg  300 mg Oral TID Mansy, Madison LABOR, MD   300  mg at 08/28/23 2055   hydrOXYzine  (ATARAX ) tablet 25 mg  25 mg Oral TID PRN Mansy, Jan A, MD       ketorolac  (TORADOL ) 15 MG/ML injection 15 mg  15 mg Intravenous Q6H PRN Mansy, Jan A, MD       magnesium  hydroxide (MILK OF MAGNESIA) suspension 30 mL  30 mL Oral Daily PRN Mansy, Jan A, MD   30 mL at 08/28/23 1558   ondansetron  (ZOFRAN ) tablet 4 mg  4 mg Oral Q6H PRN Mansy, Jan A, MD       Or   ondansetron  (ZOFRAN ) injection 4 mg  4 mg Intravenous Q6H PRN Mansy, Jan A, MD       PARoxetine  (PAXIL ) tablet 20 mg  20 mg Oral Daily Amin, Sumayya, MD   20 mg at 08/28/23 1555   QUEtiapine  (SEROQUEL ) tablet 50 mg  50 mg Oral BID Mansy, Jan A, MD   50 mg at 08/28/23 2055   traZODone  (DESYREL ) tablet 25 mg  25 mg Oral QHS PRN Mansy, Madison LABOR, MD       vancomycin  LUCRECIA) IVPB 1000 mg/200 mL  1,000 mg Intravenous Q12H Lenon Elsie HERO, [facility]   Stopped at 08/28/23 9086     Abtx:  Anti-infectives (From admission, onward)    Start     Dose/Rate Route Frequency Ordered Stop   08/28/23 2200  ceFEPIme  (MAXIPIME ) 2 g in sodium chloride  0.9 % 100 mL IVPB        2 g 200 mL/hr over 30 Minutes Intravenous Every 8 hours 08/28/23 1601     08/28/23 1115  piperacillin -tazobactam (ZOSYN ) IVPB 3.375 g  Status:  Discontinued        3.375 g 12.5 mL/hr over 240 Minutes Intravenous Every 8 hours 08/28/23 1105 08/28/23 1601   08/28/23 0800  vancomycin  (VANCOREADY) IVPB 1000 mg/200 mL        1,000 mg 200 mL/hr over 60 Minutes Intravenous Every 12 hours 08/27/23 1922     08/27/23 2100  vancomycin  (VANCOCIN ) IVPB 1000 mg/200 mL premix  Status:  Discontinued        1,000 mg 200 mL/hr over 60 Minutes  Intravenous  Once 08/27/23 2056 08/27/23 2100   08/27/23 2000  vancomycin  (VANCOREADY) IVPB 1750 mg/350 mL        1,750 mg 175 mL/hr over 120 Minutes Intravenous  Once 08/27/23 1900 08/27/23 2213   08/27/23 1845  piperacillin -tazobactam (ZOSYN ) IVPB 3.375 g        3.375 g 100 mL/hr over 30 Minutes Intravenous  Once 08/27/23 1836 08/27/23 2041       REVIEW OF SYSTEMS:  Const: negative fever,  chills, negative weight loss Eyes: negative diplopia or visual changes, negative eye pain ENT: negative coryza, negative sore throat Resp: negative cough, hemoptysis, dyspnea Cards: negative for chest pain, palpitations, lower extremity edema GU: negative for frequency, dysuria and hematuria GI: Negative for abdominal pain, diarrhea, bleeding, constipation Skin: negative for rash and pruritus Heme: negative for easy bruising and gum/nose bleeding MS: negative for myalgias, arthralgias, back pain and muscle weakness Neurolo:negative for headaches, dizziness, vertigo, memory problems  Psych: anxiety, depression  Endocrine: negative for thyroid, diabetes Allergy/Immunology-pneumococcal vaccine: Objective:  VITALS:  BP (!) 81/38   Pulse (!) 56   Temp 97.7 F (36.5 C) (Oral)   Resp 18   Ht 5' 11 (1.803 m)   Wt 83.9 kg   SpO2 (!) 88%   BMI 25.80 kg/m  LDA Foley Central line Other drainage tubes  PHYSICAL EXAM:  General: Alert, cooperative, no distress, appears stated age.  Head: Normocephalic, without obvious abnormality, atraumatic. Eyes: Conjunctivae clear, anicteric sclerae. Pupils are equal ENT Nares normal. No drainage or sinus tenderness. Lips, mucosa, and tongue normal. No Thrush Neck: Supple, symmetrical, no adenopathy, thyroid: non tender no carotid bruit and no JVD. Back: No CVA tenderness. Lungs: Clear to auscultation bilaterally. No Wheezing or Rhonchi. No rales. Heart: Regular rate and rhythm, no murmur, rub or gallop. Abdomen: Soft, non-tender,not distended. Bowel  sounds normal. No masses Extremities: Multiple areas of erythematous indurated nodules which are tender over the arms and legs The left forearm area is exuding pus There is surrounding severe induration The left leg also has induration and erythema and pus            skin: As above Lymph: Cervical, supraclavicular normal. Neurologic: Grossly non-focal Pertinent Labs Lab Results CBC    Component Value Date/Time   WBC 7.1 08/28/2023 0616   RBC 3.43 (L) 08/28/2023 0616   HGB 8.8 (L) 08/28/2023 0616   HCT 27.3 (L) 08/28/2023 0616   PLT 609 (H) 08/28/2023 0616   MCV 79.6 (L) 08/28/2023 0616   MCH 25.7 (L) 08/28/2023 0616   MCHC 32.2 08/28/2023 0616   RDW 16.5 (H) 08/28/2023 0616   LYMPHSABS 2.3 08/27/2023 1722   MONOABS 0.7 08/27/2023 1722   EOSABS 0.1 08/27/2023 1722   BASOSABS 0.1 08/27/2023 1722       Latest Ref Rng & Units 08/28/2023    6:16 AM 08/27/2023    5:22 PM 07/22/2023    6:42 PM  CMP  Glucose 70 - 99 mg/dL 98  883  84   BUN 6 - 20 mg/dL 7  8  15    Creatinine 0.44 - 1.00 mg/dL 9.06  8.91  9.19   Sodium 135 - 145 mmol/L 139  133  137   Potassium 3.5 - 5.1 mmol/L 4.2  4.1  3.7   Chloride 98 - 111 mmol/L 106  97  103   CO2 22 - 32 mmol/L 23  24  23    Calcium 8.9 - 10.3 mg/dL 8.2  9.0  9.0   Total Protein 6.5 - 8.1 g/dL  8.5  8.1   Total Bilirubin 0.0 - 1.2 mg/dL  0.4  0.5   Alkaline Phos 38 - 126 U/L  103  157   AST 15 - 41 U/L  24  24   ALT 0 - 44 U/L  11  24       Microbiology: Recent Results (from the past 240 hours)  Culture, blood (Routine x 2)     Status: None (Preliminary result)   Collection Time: 08/27/23  5:15 PM   Specimen: BLOOD  Result Value Ref Range Status   Specimen Description BLOOD RIGHT ANTECUBITAL  Final   Special Requests   Final    BOTTLES DRAWN AEROBIC ONLY Blood Culture results may not be optimal due to an inadequate volume of blood received in culture bottles   Culture   Final    NO GROWTH < 24 HOURS Performed at  [facility], [address] [place], [place] [address]    Report Status PENDING  Incomplete  Culture, blood (Routine x 2)     Status: None (Preliminary result)   Collection Time: 08/27/23  5:25 PM   Specimen: BLOOD  Result Value Ref Range Status   Specimen Description BLOOD BLOOD RIGHT FOREARM  Final   Special Requests   Final  BOTTLES DRAWN AEROBIC ONLY Blood Culture adequate volume   Culture   Final    NO GROWTH < 24 HOURS Performed at [facility], [address] [place], [place] [address]    Report Status PENDING  Incomplete  Aerobic Culture w Gram Stain (superficial specimen)     Status: None (Preliminary result)   Collection Time: 08/27/23  7:50 PM   Specimen: Wound  Result Value Ref Range Status   Specimen Description   Final    WOUND Performed at [facility], [address], [place], [place] [address]    Special Requests   Final    LL Performed at [facility], [address] [place], [place] [address]    Gram Stain NO WBC SEEN NO ORGANISMS SEEN   Final   Culture   Final    NO GROWTH < 12 HOURS Performed at [facility], [address] [place], [place] [address]    Report Status PENDING  Incomplete  Aerobic Culture w Gram Stain (superficial specimen)     Status: None (Preliminary result)   Collection Time: 08/27/23  7:50 PM   Specimen: Wound  Result Value Ref Range Status   Specimen Description   Final    WOUND Performed at [facility], [address] [place], [place] [address]    Special Requests   Final    LA Performed at [facility], [address] [place], [place] [address]    Gram Stain   Final    NO WBC SEEN RARE GRAM NEGATIVE RODS RARE GRAM POSITIVE COCCI IN PAIRS    Culture   Final    NO GROWTH < 12 HOURS Performed at [facility], [address] [place], [place] [address]    Report Status PENDING  Incomplete  Aerobic Culture w Gram Stain (superficial specimen)      Status: None (Preliminary result)   Collection Time: 08/27/23  7:50 PM   Specimen: Wound  Result Value Ref Range Status   Specimen Description   Final    WOUND Performed at [facility], [address], [place], [place] [address]    Special Requests   Final    RL Performed at [facility], [address] [place], [place] [address]    Gram Stain NO WBC SEEN NO ORGANISMS SEEN   Final   Culture   Final    NO GROWTH < 12 HOURS Performed at [facility], [address], [place], [place] [address]    Report Status PENDING  Incomplete    Lines and Device Date on insertion # of days DC  Central line     Foley     ETT       IMAGING RESULTS:  X-ray of the left forearm, left humerus, right humerus and left tibia-fibula does not show any bony involvement I have personally reviewed the films ? Impression/Recommendation Multiple abscesses of the extremities left worse than right side for the past few weeks As some of the more exuding pus Multiple cultures have been sent.  Staff aureus especially MRSA need to be ruled out. May need I&D IVDA Uses fentanyl  Skin pops at times On Suboxone  Patient is currently on vancomycin  and Zosyn .  Will change the Zosyn  to cefepime   Anxiety/depression On Paxil , Seroquel  and trazodone  Also on Atarax  and clonidine   History of Streptococcus pyogenes bacteremia in 2020  History of candidemia and 2015  Hepatitis C antibody positive Negative  viral load in 2015 May recheck it Will check HIV This consult involved complex antimicrobial management _I have personally spent  -60--minutes involved in face-to-face and non-face-to-face activities for this patient on the day of the visit. Professional time spent includes the following activities: Preparing to see the patient (review of tests), Obtaining and/or reviewing separately obtained history (admission/discharge record), Performing a medically appropriate examination  and/or evaluation , Ordering medications/tests/procedures, referring and communicating with other health care professionals, Documenting clinical information in the EMR, Independently interpreting results (not separately reported), Communicating results to the patient/family/caregiver, Counseling and educating the patient/family/caregiver and Care coordination (not separately reported).    ________________________________________________ Discussed with patient, mother and her nurse  note:  This document was prepared using Dragon voice recognition software and may include unintentional dictation errors.

## 2023-08-28 NOTE — Hospital Course (Addendum)
 Partly taken from H&P.  Vicki Whitehead is a 38 y.o. Caucasian female with medical history significant for anxiety, depression, asthma, COPD, hypertension, CVA, seizure and substance abuse including IV fentanyl , currently on Suboxone  for the last 3 weeks, who presented to the emergency room with acute onset of worsening left leg cellulitis with purulent discharge.  She was seen on outpatient basis and has been given p.o. doxycycline  in the recent past.  She had a leftover prescription that she started taking over the last 4 days without improvement.  On presentation stable vitals, labs with mild hyponatremia and hypochloremia, albumin of 2.9, leukocytosis at 14.6, microcytic anemia close to baseline and thrombocytosis at 628. Blood cultures and wound cultures was sent.  Left forearm x-ray showed soft tissue ulceration with no acute osseous abnormality left humerus x-ray showed the same right humerus x-ray showed the same with no foreign body with soft tissue swelling and air in the lateral mid arm soft tissues.  Left tibia and fibula x-ray showed diffuse subcutaneous edema and ulceration of the skin of the anterior distal shin with no acute osseous abnormality.   ID and general surgery was consulted and patient was started on Zosyn  and vancomycin .  8/21: Vitals stable, leukocytosis resolved, hemoglobin decreased to 8.8 from 9.2-all cell lines decreased so likely some dilutional effect.  One of the wound cultures with rare gram-positive cocci in pairs-otherwise no growth in less than 12 hours.  Blood cultures pending. Most of her wounds/abscesses are spontaneously draining, no current need of any incision and drainage per general surgery.  8/22: Blood pressure little soft so giving 1 L of bolus.  Wound culture still pending.  ID switched Zosyn  with cefepime . Anemia panel ordered and also started on supplement.  8/23: Hemodynamically stable, anemia panel with concern of anemia of chronic disease, some  iron deficiency, folate borderline and B12 of 355.  Wound cultures growing strep anginosus, strep constellatus and haemophilus parainfluenza-pending susceptibility.  Clinically improving.  8/24: Remained hemodynamically stable, wound cultures with different kinds of strep mostly oral flora.  Apparently she was using her saliva to clean needles?,  Received broad-spectrum antibiotics which was later de-escalated to Unasyn  and she is being discharged on 2 weeks of Augmentin  after discussing with infectious disease.  Significant clinical improvement and blood cultures remain negative.  Patient was counseled again for IV drug abuse.  Per patient she has decided to stop using those drugs and will continue with Suboxone .  Patient also has borderline soft blood pressure, she is on clonidine  at home which was continued with instructions to check her blood pressure before taking it and do not take if the systolic is less than 120.  She need to have a close follow-up with primary care provider for adjustment of her antihypertensives.  She will continue on current medications and need to change dressing regularly until abscesses stopped draining and heals.  Patient will follow-up with primary care provider and general surgery for further assistance.

## 2023-08-29 DIAGNOSIS — L03116 Cellulitis of left lower limb: Secondary | ICD-10-CM

## 2023-08-29 DIAGNOSIS — L0390 Cellulitis, unspecified: Secondary | ICD-10-CM | POA: Diagnosis not present

## 2023-08-29 DIAGNOSIS — F1910 Other psychoactive substance abuse, uncomplicated: Secondary | ICD-10-CM | POA: Diagnosis not present

## 2023-08-29 DIAGNOSIS — L03119 Cellulitis of unspecified part of limb: Secondary | ICD-10-CM | POA: Diagnosis not present

## 2023-08-29 DIAGNOSIS — L02416 Cutaneous abscess of left lower limb: Secondary | ICD-10-CM | POA: Diagnosis not present

## 2023-08-29 DIAGNOSIS — L02414 Cutaneous abscess of left upper limb: Secondary | ICD-10-CM | POA: Diagnosis not present

## 2023-08-29 LAB — RETICULOCYTES
Immature Retic Fract: 13.7 % (ref 2.3–15.9)
RBC.: 3.49 MIL/uL — ABNORMAL LOW (ref 3.87–5.11)
Retic Count, Absolute: 96 K/uL (ref 19.0–186.0)
Retic Ct Pct: 2.8 % (ref 0.4–3.1)

## 2023-08-29 LAB — FERRITIN: Ferritin: 108 ng/mL (ref 11–307)

## 2023-08-29 LAB — IRON AND TIBC
Iron: 18 ug/dL — ABNORMAL LOW (ref 28–170)
Saturation Ratios: 8 % — ABNORMAL LOW (ref 10.4–31.8)
TIBC: 237 ug/dL — ABNORMAL LOW (ref 250–450)
UIBC: 219 ug/dL

## 2023-08-29 LAB — FOLATE: Folate: 6.2 ng/mL (ref >5.9)

## 2023-08-29 LAB — HIV ANTIBODY (ROUTINE TESTING W REFLEX): HIV Screen 4th Generation wRfx: NONREACTIVE

## 2023-08-29 MED ORDER — LACTATED RINGERS IV BOLUS
1000.0000 mL | Freq: Once | INTRAVENOUS | Status: AC
  Administered 2023-08-29: 1000 mL via INTRAVENOUS

## 2023-08-29 MED ORDER — FE FUM-VIT C-VIT B12-FA 460-60-0.01-1 MG PO CAPS
1.0000 | ORAL_CAPSULE | Freq: Two times a day (BID) | ORAL | Status: DC
  Administered 2023-08-29 – 2023-08-31 (×4): 1 via ORAL
  Filled 2023-08-29 (×4): qty 1

## 2023-08-29 NOTE — Progress Notes (Signed)
 Date of Admission:  08/27/2023    ID: Vicki Whitehead is a 38 y.o. female  Principal Problem:   Cellulitis and abscess of left lower extremity Active Problems:   Cellulitis and abscess of upper extremity   Asthma with COPD (HCC)   Anxiety and depression   IV drug abuse (HCC)   Peripheral neuropathy   Essential hypertension   Sepsis due to cellulitis (HCC)   Abscess of left arm   Abscess of right arm   Left leg cellulitis    Subjective: Pt feeling better Says left arm and leg feels better swelling an drainage reduced  Medications:   buprenorphine -naloxone   1 tablet Sublingual BID   cloNIDine  HCl  0.1 mg Oral Q0600   enoxaparin  (LOVENOX ) injection  40 mg Subcutaneous Q24H   fluticasone  furoate-vilanterol  1 puff Inhalation Daily   gabapentin   300 mg Oral TID   PARoxetine   20 mg Oral Daily   QUEtiapine   50 mg Oral BID    Objective: Vital signs in last 24 hours: Patient Vitals for the past 24 hrs:  BP Temp Temp src Pulse Resp SpO2  08/29/23 0758 101/68 (!) 97.5 F (36.4 C) Oral 62 15 98 %  08/29/23 0415 98/61 97.8 F (36.6 C) -- 68 17 96 %  08/28/23 2243 102/65 -- -- 67 -- 97 %  08/28/23 1932 (!) 81/38 -- -- (!) 56 -- --  08/28/23 1931 (!) 77/45 97.7 F (36.5 C) Oral 73 18 (!) 88 %  08/28/23 1535 108/68 98.1 F (36.7 C) -- 67 18 98 %  08/28/23 1505 106/62 98.1 F (36.7 C) Oral (!) 58 18 97 %  08/28/23 1500 -- -- -- (!) 59 15 97 %     Lines and Device Date on insertion # of days DC  Engineer, technical sales     ETT       PHYSICAL EXAM:  General: Alert, cooperative, no distress, appears stated age.  Lungs: Clear to auscultation bilaterally. No Wheezing or Rhonchi. No rales. Heart: Regular rate and rhythm, no murmur, rub or gallop. Abdomen: Soft, non-tender,not distended. Bowel sounds normal. No masses Extremities: left fore arm and rm abscesses/draining, surrounding erythema and swelling  better Left leg swelling better, erythema better Skin:  No rashes or lesions. Or bruising Lymph: Cervical, supraclavicular normal. Neurologic: Grossly non-focal  Lab Results    Latest Ref Rng & Units 08/28/2023    6:16 AM 08/27/2023    5:22 PM 07/22/2023    6:42 PM  CBC  WBC 4.0 - 10.5 K/uL 7.1  14.6  9.0   Hemoglobin 12.0 - 15.0 g/dL 8.8  9.2  9.3   Hematocrit 36.0 - 46.0 % 27.3  29.2  28.7   Platelets 150 - 400 K/uL 609  628  315        Latest Ref Rng & Units 08/28/2023    6:16 AM 08/27/2023    5:22 PM 07/22/2023    6:42 PM  CMP  Glucose 70 - 99 mg/dL 98  883  84   BUN 6 - 20 mg/dL 7  8  15    Creatinine 0.44 - 1.00 mg/dL 9.06  8.91  9.19   Sodium 135 - 145 mmol/L 139  133  137   Potassium 3.5 - 5.1 mmol/L 4.2  4.1  3.7   Chloride 98 - 111 mmol/L 106  97  103   CO2 22 - 32 mmol/L 23  24  23    Calcium  8.9 - 10.3 mg/dL 8.2  9.0  9.0   Total Protein 6.5 - 8.1 g/dL  8.5  8.1   Total Bilirubin 0.0 - 1.2 mg/dL  0.4  0.5   Alkaline Phos 38 - 126 U/L  103  157   AST 15 - 41 U/L  24  24   ALT 0 - 44 U/L  11  24       Microbiology: Abscess culture pending  Studies/Results: DG Forearm Left Result Date: 08/27/2023 CLINICAL DATA:  Skin infection.  Evaluate for osteomyelitis. EXAM: LEFT HUMERUS - 2+ VIEW; LEFT FOREARM - 2 VIEW COMPARISON:  None Available. FINDINGS: No acute fracture or dislocation. The bones are well mineralized. No arthritic changes. No periosteal elevation or bone erosion. Implantable contraceptive in the soft tissues of the arm. A 5 mm linear density in the soft tissues of the anterior elbow noted. There is ulceration of the skin of the forearm. Small pocket of soft tissue gas in the dorsum of the forearm may be within the skin. IMPRESSION: 1. No acute osseous abnormality. No radiographic evidence of osteomyelitis. 2. Soft tissue ulceration of the forearm. Electronically Signed   By: Vanetta Chou M.D.   On: 08/27/2023 18:40   DG Humerus Left Result Date: 08/27/2023 CLINICAL DATA:  Skin infection.  Evaluate for  osteomyelitis. EXAM: LEFT HUMERUS - 2+ VIEW; LEFT FOREARM - 2 VIEW COMPARISON:  None Available. FINDINGS: No acute fracture or dislocation. The bones are well mineralized. No arthritic changes. No periosteal elevation or bone erosion. Implantable contraceptive in the soft tissues of the arm. A 5 mm linear density in the soft tissues of the anterior elbow noted. There is ulceration of the skin of the forearm. Small pocket of soft tissue gas in the dorsum of the forearm may be within the skin. IMPRESSION: 1. No acute osseous abnormality. No radiographic evidence of osteomyelitis. 2. Soft tissue ulceration of the forearm. Electronically Signed   By: Vanetta Chou M.D.   On: 08/27/2023 18:40   DG Humerus Right Result Date: 08/27/2023 CLINICAL DATA:  Soft tissue infection EXAM: RIGHT HUMERUS - 2+ VIEW COMPARISON:  None Available. FINDINGS: There is no evidence of fracture or other focal bone lesions. There is soft tissue swelling and air in the lateral mid arm soft tissues. No foreign body. IMPRESSION: Soft tissue swelling and air in the lateral mid arm soft tissues. No foreign body. Electronically Signed   By: Greig Pique M.D.   On: 08/27/2023 18:39   DG Tibia/Fibula Left Result Date: 08/27/2023 CLINICAL DATA:  Skin infection. EXAM: LEFT TIBIA AND FIBULA - 2 VIEW COMPARISON:  Radiograph dated 06/04/2023. FINDINGS: No acute fracture or dislocation. The bones are well mineralized. No periosteal elevation or bone erosion. Chronic calcification adjacent to the medial malleolus. There is diffuse subcutaneous edema. There is ulceration of the skin of the anterior distal shin. No radiopaque foreign object or subcu gas. IMPRESSION: 1. No acute osseous abnormality. 2. Diffuse subcutaneous edema. 3. Ulceration of the skin of the anterior distal shin. Electronically Signed   By: Vanetta Chou M.D.   On: 08/27/2023 18:37     Assessment/Plan: Multiple abscesses of the extremities left worse than right side for the  past few weeks As some of the more exuding pus Multiple cultures have been sent.  On vanco and cefepime - once cultures result we can decide on oral antibiotics   IVDA Uses fentanyl  Skin pops at times On Suboxone     Anxiety/depression On Paxil , Seroquel  and  trazodone  Also on Atarax  and clonidine    History of Streptococcus pyogenes bacteremia in 2020   History of candidemia and 2015   Hepatitis C antibody positive Negative viral load in 2015  I rechecked today HIV NR  Discussed the management with the patient  On call ID available by phone for urgent issues this weekend- call if needed

## 2023-08-29 NOTE — Progress Notes (Signed)
 [facility] SURGICAL PROGRESS NOTE (cpt [phone])  Hospital Day(s): 2.  Interval History: Patient seen and examined, no acute events or new complaints overnight. Patient reports she continues to have soreness to the left forearm and left anterior shin. No fever. No new labs this morning. Wound Cx growing GNR and GPC in chains. She is on Cefepime  and Vancomycin   Review of Systems:  Constitutional: denies fever, chills  HEENT: denies cough or congestion  Respiratory: denies any shortness of breath  Cardiovascular: denies chest pain or palpitations  Genitourinary: denies burning with urination or urinary frequency Musculoskeletal: denies pain, decreased motor or sensation Integumentary: + multiple areas of cellulitis and draining abscess  Vital signs in last 24 hours: [min-max] current  Temp:  [97.7 F (36.5 C)-98.2 F (36.8 C)] 97.8 F (36.6 C) (08/22 0415) Pulse Rate:  [56-86] 68 (08/22 0415) Resp:  [15-18] 17 (08/22 0415) BP: (77-108)/(38-68) 98/61 (08/22 0415) SpO2:  [88 %-98 %] 96 % (08/22 0415)     Height: 5' 11 (180.3 cm) Weight: 83.9 kg BMI (Calculated): 25.81   Intake/Output last 2 shifts:  08/21 0701 - 08/22 0700 In: 240 [P.O.:240] Out: -    Physical Exam:  Constitutional: alert, cooperative and no distress  HENT: normocephalic without obvious abnormality  Respiratory: breathing non-labored at rest  Integumentary: Left Forearm: There are two areas one to the volar and dorsal aspect of the mid forearm with open wounds, these are both spontaneously draining, erythema is improved compared to yesterday. There is also a small area or erythema and tenderness to the left elbow but this is without gross purulence.    Left Upper Arm: There is a small wound overlying the bicep, surrounding erythema improving, tender, these are both widely open and draining spontaneously   Left Lower Leg: Significant erythema and tenderness to the anterior left lower leg, there is  central wound, this is open this morning and draining   Right Upper Extremity: Multiple scatter small areas of erythema, no drainage    Labs:     Latest Ref Rng & Units 08/28/2023    6:16 AM 08/27/2023    5:22 PM 07/22/2023    6:42 PM  CBC  WBC 4.0 - 10.5 K/uL 7.1  14.6  9.0   Hemoglobin 12.0 - 15.0 g/dL 8.8  9.2  9.3   Hematocrit 36.0 - 46.0 % 27.3  29.2  28.7   Platelets 150 - 400 K/uL 609  628  315       Latest Ref Rng & Units 08/28/2023    6:16 AM 08/27/2023    5:22 PM 07/22/2023    6:42 PM  CMP  Glucose 70 - 99 mg/dL 98  883  84   BUN 6 - 20 mg/dL 7  8  15    Creatinine 0.44 - 1.00 mg/dL 9.06  8.91  9.19   Sodium 135 - 145 mmol/L 139  133  137   Potassium 3.5 - 5.1 mmol/L 4.2  4.1  3.7   Chloride 98 - 111 mmol/L 106  97  103   CO2 22 - 32 mmol/L 23  24  23    Calcium 8.9 - 10.3 mg/dL 8.2  9.0  9.0   Total Protein 6.5 - 8.1 g/dL  8.5  8.1   Total Bilirubin 0.0 - 1.2 mg/dL  0.4  0.5   Alkaline Phos 38 - 126 U/L  103  157   AST 15 - 41 U/L  24  24   ALT 0 -  44 U/L  11  24     Imaging studies: No new pertinent imaging studies   Assessment/Plan: 38 y.o. female with multiple scatter areas of cellulitis and spontaneously draining abscesses to the LUE and LLE.               - These multiple areas of abscesses the worse seeming to be the left forearm and left anterior shin. These fortunately have been spontaneously draining already and some erythema is improving. For now, I think we can allow these to drain spontaneously without introducing large wounds. Should these start to worsen at anytime, she of course well need formal I&D, likely in the OR. Would continue aggressive Abx and follow up CX. She can continue superficial dressing changes daily and as needed. Pain control prn. We will follow for now.   All of the above findings and recommendations were discussed with the patient, and the medical team, and all of patient's questions were answered to her expressed  satisfaction.  -- Yanique Mulvihill, PA-C [facility] 08/29/2023, 7:30 AM M-F: 7am - 4pm

## 2023-08-29 NOTE — Progress Notes (Signed)
 Progress Note   Patient: Vicki Whitehead FMW:[id number] DOB: 1985-08-08 DOA: 08/27/2023     2 DOS: the patient was seen and examined on 08/29/2023   Brief hospital course: Partly taken from H&P.  Vicki Whitehead is a 38 y.o. Caucasian female with medical history significant for anxiety, depression, asthma, COPD, hypertension, CVA, seizure and substance abuse including IV fentanyl , currently on Suboxone  for the last 3 weeks, who presented to the emergency room with acute onset of worsening left leg cellulitis with purulent discharge.  She was seen on outpatient basis and has been given p.o. doxycycline  in the recent past.  She had a leftover prescription that she started taking over the last 4 days without improvement.  On presentation stable vitals, labs with mild hyponatremia and hypochloremia, albumin of 2.9, leukocytosis at 14.6, microcytic anemia close to baseline and thrombocytosis at 628. Blood cultures and wound cultures was sent.  Left forearm x-ray showed soft tissue ulceration with no acute osseous abnormality left humerus x-ray showed the same right humerus x-ray showed the same with no foreign body with soft tissue swelling and air in the lateral mid arm soft tissues.  Left tibia and fibula x-ray showed diffuse subcutaneous edema and ulceration of the skin of the anterior distal shin with no acute osseous abnormality.   ID and general surgery was consulted and patient was started on Zosyn  and vancomycin .  8/21: Vitals stable, leukocytosis resolved, hemoglobin decreased to 8.8 from 9.2-all cell lines decreased so likely some dilutional effect.  One of the wound cultures with rare gram-positive cocci in pairs-otherwise no growth in less than 12 hours.  Blood cultures pending. Most of her wounds/abscesses are spontaneously draining, no current need of any incision and drainage per general surgery.  8/22: Blood pressure little soft so giving 1 L of bolus.  Wound culture still pending.  ID  switched Zosyn  with cefepime . Anemia panel ordered and also started on supplement  Assessment and Plan: * Cellulitis and abscess of left lower extremity Patient has multiple abscesses with surrounding cellulitis involving the left lower extremity and bilateral upper extremities, Some of them are spontaneously draining, no obvious need for any incision and drainage per general surgery.  Preliminary blood cultures negative, one of the wound culture growing gram-positive cocci and gram-negative rods. Secondary to IV drug use as her symptoms started while she was using IV drugs -Zosyn  was switched with cefepime  by ID - Continue with  vancomycin  -Continue with supportive care -Follow-up cultures  Cellulitis and abscess of upper extremity - Management as above.  Sepsis due to cellulitis [facility]) Patient met sepsis criteria with leukocytosis and borderline tachycardia.  Secondary to multiple abscesses with IV drug use. - Management as above  IV drug abuse (HCC) Last needle use was about 2 weeks ago. Counseling was provided - Will continue Suboxone .  Anxiety and depression - Will continue Paxil  and Seroquel .  Asthma with COPD (HCC) - Will continue her inhalers.  Essential hypertension Blood pressure borderline soft - Ordered 1 L bolus as she already received today's dose of clonidine  -Will place parameters on clonidine  dose  Peripheral neuropathy - Will continue Neurontin .   Subjective: Patient was seen and examined today.  No new concern.  Continue to have significant discharge from left lower extremity abscess.  Physical Exam: Vitals:   08/28/23 2243 08/29/23 0415 08/29/23 0758 08/29/23 1517  BP: 102/65 98/61 101/68 (!) 85/50  Pulse: 67 68 62 72  Resp:  17 15 16   Temp:  97.8 F (36.6 C) (!)  97.5 F (36.4 C) 97.6 F (36.4 C)  TempSrc:   Oral Oral  SpO2: 97% 96% 98% 98%  Weight:      Height:       General.  Well-developed lady, in no acute distress. Pulmonary.  Lungs  clear bilaterally, normal respiratory effort. CV.  Regular rate and rhythm, no JVD, rub or murmur. Abdomen.  Soft, nontender, nondistended, BS positive. CNS.  Alert and oriented .  No focal neurologic deficit. Extremities.  Soiled bandage on left lower extremity, surrounding erythema improving, multiple bandages on bilateral upper extremities Psychiatry.  Judgment and insight appears normal.    Data Reviewed: Prior data reviewed.  Family Communication: Discussed with patient  Disposition: Status is: Inpatient Remains inpatient appropriate because: Severity of illness  Planned Discharge Destination: Home  DVT prophylaxis.  Lovenox  Time spent: 50 minutes  This record has been created using Conservation officer, historic buildings. Errors have been sought and corrected,but may not always be located. Such creation errors do not reflect on the standard of care.   Author: Amaryllis Dare, MD 08/29/2023 3:32 PM  For on call review [email or web address].

## 2023-08-29 NOTE — Assessment & Plan Note (Addendum)
 Patient has multiple abscesses with surrounding cellulitis involving the left lower extremity and bilateral upper extremities, Some of them are spontaneously draining, no obvious need for any incision and drainage per general surgery.  Preliminary blood cultures negative,  wound culture growing strep anginosus, strep constellatus and haemophilus parainfluenza-pending susceptibility Secondary to IV drug use as her symptoms started while she was using IV drugs -Zosyn  was switched with cefepime  by ID - Continue with  vancomycin  and cefepime  for now -Continue with supportive care -Follow-up cultures

## 2023-08-29 NOTE — Assessment & Plan Note (Addendum)
 Blood pressure currently within goal. - Continue clonidine  with parameters

## 2023-08-30 DIAGNOSIS — L03119 Cellulitis of unspecified part of limb: Secondary | ICD-10-CM | POA: Diagnosis not present

## 2023-08-30 DIAGNOSIS — L02416 Cutaneous abscess of left lower limb: Secondary | ICD-10-CM | POA: Diagnosis not present

## 2023-08-30 DIAGNOSIS — L03116 Cellulitis of left lower limb: Secondary | ICD-10-CM | POA: Diagnosis not present

## 2023-08-30 DIAGNOSIS — L0390 Cellulitis, unspecified: Secondary | ICD-10-CM | POA: Diagnosis not present

## 2023-08-30 DIAGNOSIS — L02414 Cutaneous abscess of left upper limb: Secondary | ICD-10-CM | POA: Diagnosis not present

## 2023-08-30 DIAGNOSIS — F1910 Other psychoactive substance abuse, uncomplicated: Secondary | ICD-10-CM | POA: Diagnosis not present

## 2023-08-30 LAB — VITAMIN B12: Vitamin B-12: 355 pg/mL (ref 180–914)

## 2023-08-30 LAB — CREATININE, SERUM
Creatinine, Ser: 0.95 mg/dL (ref 0.44–1.00)
GFR, Estimated: >60 mL/min (ref >60)

## 2023-08-30 LAB — HCV RNA QUANT: HCV Quantitative: NOT DETECTED [IU]/mL (ref >50)

## 2023-08-30 NOTE — Progress Notes (Signed)
 Progress Note   Patient: Vicki Whitehead FMW:[id number] DOB: Jun 11, 1985 DOA: 08/27/2023     3 DOS: the patient was seen and examined on 08/30/2023   Brief hospital course: Partly taken from H&P.  Klyn Newgent is a 38 y.o. Caucasian female with medical history significant for anxiety, depression, asthma, COPD, hypertension, CVA, seizure and substance abuse including IV fentanyl , currently on Suboxone  for the last 3 weeks, who presented to the emergency room with acute onset of worsening left leg cellulitis with purulent discharge.  She was seen on outpatient basis and has been given p.o. doxycycline  in the recent past.  She had a leftover prescription that she started taking over the last 4 days without improvement.  On presentation stable vitals, labs with mild hyponatremia and hypochloremia, albumin of 2.9, leukocytosis at 14.6, microcytic anemia close to baseline and thrombocytosis at 628. Blood cultures and wound cultures was sent.  Left forearm x-ray showed soft tissue ulceration with no acute osseous abnormality left humerus x-ray showed the same right humerus x-ray showed the same with no foreign body with soft tissue swelling and air in the lateral mid arm soft tissues.  Left tibia and fibula x-ray showed diffuse subcutaneous edema and ulceration of the skin of the anterior distal shin with no acute osseous abnormality.   ID and general surgery was consulted and patient was started on Zosyn  and vancomycin .  8/21: Vitals stable, leukocytosis resolved, hemoglobin decreased to 8.8 from 9.2-all cell lines decreased so likely some dilutional effect.  One of the wound cultures with rare gram-positive cocci in pairs-otherwise no growth in less than 12 hours.  Blood cultures pending. Most of her wounds/abscesses are spontaneously draining, no current need of any incision and drainage per general surgery.  8/22: Blood pressure little soft so giving 1 L of bolus.  Wound culture still pending.  ID  switched Zosyn  with cefepime . Anemia panel ordered and also started on supplement.  8/23: Hemodynamically stable, anemia panel with concern of anemia of chronic disease, some iron deficiency, folate borderline and B12 of 355.  Wound cultures growing strep anginosus, strep constellatus and haemophilus parainfluenza-pending susceptibility.  Clinically improving  Assessment and Plan: * Cellulitis and abscess of left lower extremity Patient has multiple abscesses with surrounding cellulitis involving the left lower extremity and bilateral upper extremities, Some of them are spontaneously draining, no obvious need for any incision and drainage per general surgery.  Preliminary blood cultures negative,  wound culture growing strep anginosus, strep constellatus and haemophilus parainfluenza-pending susceptibility Secondary to IV drug use as her symptoms started while she was using IV drugs -Zosyn  was switched with cefepime  by ID - Continue with  vancomycin  and cefepime  for now -Continue with supportive care -Follow-up cultures  Cellulitis and abscess of upper extremity - Management as above.  Sepsis due to cellulitis [facility]) Patient met sepsis criteria with leukocytosis and borderline tachycardia.  Secondary to multiple abscesses with IV drug use. - Management as above  IV drug abuse (HCC) Last needle use was about 2 weeks ago. Counseling was provided - Will continue Suboxone .  Anxiety and depression - Will continue Paxil  and Seroquel .  Asthma with COPD (HCC) - Will continue her inhalers.  Essential hypertension Blood pressure currently within goal. - Continue clonidine  with parameters  Peripheral neuropathy - Will continue Neurontin .   Subjective: Patient was seen and examined today.  Feeling much improved and asking about discharge.  No new concern.  Physical Exam: Vitals:   08/29/23 1517 08/29/23 2026 08/30/23 0354 08/30/23 9273  BP: (!) 85/50 (!) 96/59 (!) 97/56 110/76   Pulse: 72 70 64 62  Resp: 16 16 16 16   Temp: 97.6 F (36.4 C) 97.8 F (36.6 C)  97.6 F (36.4 C)  TempSrc: Oral Oral  Oral  SpO2: 98% 98% 96% 97%  Weight:      Height:       General.  Well-developed lady, in no acute distress. Pulmonary.  Lungs clear bilaterally, normal respiratory effort. CV.  Regular rate and rhythm, no JVD, rub or murmur. Abdomen.  Soft, nontender, nondistended, BS positive. CNS.  Alert and oriented .  No focal neurologic deficit. Extremities.  Significantly improved erythema and clean bandages on bilateral upper and left lower extremity Psychiatry.  Judgment and insight appears normal.   Data Reviewed: Prior data reviewed.  Family Communication: Discussed with patient and boyfriend at bedside  Disposition: Status is: Inpatient Remains inpatient appropriate because: Severity of illness  Planned Discharge Destination: Home  DVT prophylaxis.  Lovenox  Time spent: 50 minutes  This record has been created using Conservation officer, historic buildings. Errors have been sought and corrected,but may not always be located. Such creation errors do not reflect on the standard of care.   Author: Amaryllis Dare, MD 08/30/2023 2:38 PM  For on call review [email or web address].

## 2023-08-30 NOTE — Progress Notes (Signed)
 CC: cellulitis Subjective: Doing better no new complaints wishes to go home  Cultures w strep Objective: Vital signs in last 24 hours: Temp:  [97.6 F (36.4 C)-97.8 F (36.6 C)] 97.6 F (36.4 C) (08/23 0726) Pulse Rate:  [62-72] 62 (08/23 0726) Resp:  [16] 16 (08/23 0726) BP: (85-110)/(50-76) 110/76 (08/23 0726) SpO2:  [96 %-98 %] 97 % (08/23 0726) Last BM Date : 08/21/23  Intake/Output from previous day: 08/22 0701 - 08/23 0700 In: 780 [P.O.:480; IV Piggyback:300] Out: -  Intake/Output this shift: No intake/output data recorded.  Physical exam: NAD non toxic Constitutional: alert, cooperative and no distress  HENT: normocephalic without obvious abnormality  Respiratory: breathing non-labored at rest  Integumentary: Left Forearm: There are two areas one to the volar and dorsal aspect of the mid forearm with open wounds, these are both w good granulation tissue  Left Upper Arm: There is a small wound overlying the bicep, surrounding erythema improving, tender, these are both widely open and draining spontaneously No evidence of necrotizing infection    Lab Results: CBC  Recent Labs    08/27/23 1722 08/28/23 0616  WBC 14.6* 7.1  HGB 9.2* 8.8*  HCT 29.2* 27.3*  PLT 628* 609*   BMET Recent Labs    08/27/23 1722 08/28/23 0616 08/30/23 0433  NA 133* 139  --   K 4.1 4.2  --   CL 97* 106  --   CO2 24 23  --   GLUCOSE 116* 98  --   BUN 8 7  --   CREATININE 1.08* 0.93 0.95  CALCIUM 9.0 8.2*  --    PT/INR Recent Labs    08/27/23 1722  LABPROT 14.1  INR 1.0   ABG No results for input(s): PHART, HCO3 in the last 72 hours.  Invalid input(s): PCO2, PO2  Studies/Results: No results found.  Anti-infectives: Anti-infectives (From admission, onward)    Start     Dose/Rate Route Frequency Ordered Stop   08/28/23 2200  ceFEPIme  (MAXIPIME ) 2 g in sodium chloride  0.9 % 100 mL IVPB        2 g 200 mL/hr over 30 Minutes Intravenous Every 8 hours 08/28/23  1601     08/28/23 1115  piperacillin -tazobactam (ZOSYN ) IVPB 3.375 g  Status:  Discontinued        3.375 g 12.5 mL/hr over 240 Minutes Intravenous Every 8 hours 08/28/23 1105 08/28/23 1601   08/28/23 0800  vancomycin  (VANCOREADY) IVPB 1000 mg/200 mL        1,000 mg 200 mL/hr over 60 Minutes Intravenous Every 12 hours 08/27/23 1922     08/27/23 2100  vancomycin  (VANCOCIN ) IVPB 1000 mg/200 mL premix  Status:  Discontinued        1,000 mg 200 mL/hr over 60 Minutes Intravenous  Once 08/27/23 2056 08/27/23 2100   08/27/23 2000  vancomycin  (VANCOREADY) IVPB 1750 mg/350 mL        1,750 mg 175 mL/hr over 120 Minutes Intravenous  Once 08/27/23 1900 08/27/23 2213   08/27/23 1845  piperacillin -tazobactam (ZOSYN ) IVPB 3.375 g        3.375 g 100 mL/hr over 30 Minutes Intravenous  Once 08/27/23 1836 08/27/23 2041       Assessment/Plan: 38 y.o. female with multiple scatter areas of cellulitis and spontaneously draining collections to the LUE and LLE. Responding to antibiotic therapy  No need for surgical intervention. May DC when medicine feels she is ready. I personally spent a total of 35 minutes in the care of  the patient today including performing a medically appropriate exam/evaluation, counseling and educating, placing orders, referring and communicating with other health care professionals, documenting clinical information in the EHR, independently interpreting and reviewing images studies and coordinating care.     Laneta Luna, MD, [facility]  08/30/2023

## 2023-08-30 NOTE — Plan of Care (Signed)

## 2023-08-31 DIAGNOSIS — L03116 Cellulitis of left lower limb: Secondary | ICD-10-CM | POA: Diagnosis not present

## 2023-08-31 DIAGNOSIS — L0390 Cellulitis, unspecified: Secondary | ICD-10-CM | POA: Diagnosis not present

## 2023-08-31 DIAGNOSIS — L02413 Cutaneous abscess of right upper limb: Secondary | ICD-10-CM | POA: Diagnosis not present

## 2023-08-31 DIAGNOSIS — L02414 Cutaneous abscess of left upper limb: Secondary | ICD-10-CM | POA: Diagnosis not present

## 2023-08-31 LAB — AEROBIC CULTURE W GRAM STAIN (SUPERFICIAL SPECIMEN)
Culture: NO GROWTH
Gram Stain: NONE SEEN
Gram Stain: NONE SEEN
Gram Stain: NONE SEEN

## 2023-08-31 MED ORDER — SODIUM CHLORIDE 0.9 % IV SOLN
3.0000 g | Freq: Four times a day (QID) | INTRAVENOUS | Status: DC
  Filled 2023-08-31 (×2): qty 8

## 2023-08-31 MED ORDER — FE FUM-VIT C-VIT B12-FA 460-60-0.01-1 MG PO CAPS: 1.0000 | ORAL_CAPSULE | Freq: Two times a day (BID) | ORAL | 0 refills | Status: AC

## 2023-08-31 MED ORDER — AMOXICILLIN-POT CLAVULANATE 875-125 MG PO TABS: 1.0000 | ORAL_TABLET | Freq: Two times a day (BID) | ORAL | 0 refills | Status: AC

## 2023-08-31 NOTE — Progress Notes (Signed)
 Discharge criteria met.  AVS gone over with pt and IV removed.

## 2023-08-31 NOTE — Plan of Care (Signed)
  Problem: Education: Goal: Knowledge of General Education information will improve Description: Including pain rating scale, medication(s)/side effects and non-pharmacologic comfort measures Outcome: Progressing   Problem: Health Behavior/Discharge Planning: Goal: Ability to manage health-related needs will improve Outcome: Progressing   Problem: Clinical Measurements: Goal: Ability to maintain clinical measurements within normal limits will improve Outcome: Progressing   Problem: Activity: Goal: Risk for activity intolerance will decrease Outcome: Progressing   Problem: Nutrition: Goal: Adequate nutrition will be maintained Outcome: Progressing   Problem: Elimination: Goal: Will not experience complications related to bowel motility Outcome: Progressing   Problem: Pain Managment: Goal: General experience of comfort will improve and/or be controlled Outcome: Progressing   Problem: Safety: Goal: Ability to remain free from injury will improve Outcome: Progressing   Problem: Skin Integrity: Goal: Risk for impaired skin integrity will decrease Outcome: Progressing

## 2023-08-31 NOTE — Discharge Summary (Signed)
 Physician Discharge Summary   Patient: Vicki Whitehead MRN: [id number] DOB: 17-Aug-1985  Admit date:     08/27/2023  Discharge date: 08/31/23  Discharge Physician: Amaryllis Dare   PCP: Ziglar, Susan K, MD   Recommendations at discharge:  Please obtain CBC and BMP on follow-up Please continue counseling for IV drug use Patient is currently on Suboxone  Please ensure completion of 2-week course of antibiotic Follow-up with primary care provider Follow-up with surgery  Discharge Diagnoses: Principal Problem:   Cellulitis and abscess of left lower extremity Active Problems:   Cellulitis and abscess of upper extremity   Sepsis due to cellulitis (HCC)   IV drug abuse (HCC)   Anxiety and depression   Asthma with COPD (HCC)   Essential hypertension   Peripheral neuropathy   Abscess of left arm   Abscess of right arm   Left leg cellulitis   Hospital Course: Partly taken from H&P.  Vicki Whitehead is a 38 y.o. Caucasian female with medical history significant for anxiety, depression, asthma, COPD, hypertension, CVA, seizure and substance abuse including IV fentanyl , currently on Suboxone  for the last 3 weeks, who presented to the emergency room with acute onset of worsening left leg cellulitis with purulent discharge.  She was seen on outpatient basis and has been given p.o. doxycycline  in the recent past.  She had a leftover prescription that she started taking over the last 4 days without improvement.  On presentation stable vitals, labs with mild hyponatremia and hypochloremia, albumin of 2.9, leukocytosis at 14.6, microcytic anemia close to baseline and thrombocytosis at 628. Blood cultures and wound cultures was sent.  Left forearm x-ray showed soft tissue ulceration with no acute osseous abnormality left humerus x-ray showed the same right humerus x-ray showed the same with no foreign body with soft tissue swelling and air in the lateral mid arm soft tissues.  Left tibia and fibula x-ray  showed diffuse subcutaneous edema and ulceration of the skin of the anterior distal shin with no acute osseous abnormality.   ID and general surgery was consulted and patient was started on Zosyn  and vancomycin .  8/21: Vitals stable, leukocytosis resolved, hemoglobin decreased to 8.8 from 9.2-all cell lines decreased so likely some dilutional effect.  One of the wound cultures with rare gram-positive cocci in pairs-otherwise no growth in less than 12 hours.  Blood cultures pending. Most of her wounds/abscesses are spontaneously draining, no current need of any incision and drainage per general surgery.  8/22: Blood pressure little soft so giving 1 L of bolus.  Wound culture still pending.  ID switched Zosyn  with cefepime . Anemia panel ordered and also started on supplement.  8/23: Hemodynamically stable, anemia panel with concern of anemia of chronic disease, some iron deficiency, folate borderline and B12 of 355.  Wound cultures growing strep anginosus, strep constellatus and haemophilus parainfluenza-pending susceptibility.  Clinically improving.  8/24: Remained hemodynamically stable, wound cultures with different kinds of strep mostly oral flora.  Apparently she was using her saliva to clean needles?,  Received broad-spectrum antibiotics which was later de-escalated to Unasyn  and she is being discharged on 2 weeks of Augmentin  after discussing with infectious disease.  Significant clinical improvement and blood cultures remain negative.  Patient was counseled again for IV drug abuse.  Per patient she has decided to stop using those drugs and will continue with Suboxone .  Patient also has borderline soft blood pressure, she is on clonidine  at home which was continued with instructions to check her blood pressure before taking  it and do not take if the systolic is less than 120.  She need to have a close follow-up with primary care provider for adjustment of her antihypertensives.  She will  continue on current medications and need to change dressing regularly until abscesses stopped draining and heals.  Patient will follow-up with primary care provider and general surgery for further assistance.  Assessment and Plan: * Cellulitis and abscess of left lower extremity Patient has multiple abscesses with surrounding cellulitis involving the left lower extremity and bilateral upper extremities, Some of them are spontaneously draining, no obvious need for any incision and drainage per general surgery.  Preliminary blood cultures negative,  wound culture growing strep anginosus, strep constellatus and haemophilus parainfluenza-all mostly oral flora. Secondary to IV drug use as her symptoms started while she was using IV drugs -Received Zosyn  followed by cefepime  and vancomycin  while in the hospital, later it was de-escalated to Unasyn  and now she is being discharged on Augmentin . -Continue with supportive care -Follow-up cultures  Cellulitis and abscess of upper extremity - Management as above.  Sepsis due to cellulitis [facility]) Patient met sepsis criteria with leukocytosis and borderline tachycardia.  Secondary to multiple abscesses with IV drug use. - Management as above  IV drug abuse (HCC) Last needle use was about 2 weeks ago. Counseling was provided - Will continue Suboxone .  Anxiety and depression - Will continue Paxil  and Seroquel .  Asthma with COPD (HCC) - Will continue her inhalers.  Essential hypertension Blood pressure currently within goal. - Continue clonidine  with parameters  Peripheral neuropathy - Will continue Neurontin .  Consultants: General Surgery.  Infectious disease Procedures performed: None Disposition: Home Diet recommendation:  Discharge Diet Orders (From admission, onward)     Start     Ordered   08/31/23 0000  Diet - low sodium heart healthy        08/31/23 1049           Regular diet DISCHARGE MEDICATION: Allergies as of  08/31/2023       Reactions   Pneumococcal Vaccine Other (See Comments)   cellulitis        Medication List     STOP taking these medications    budesonide -formoterol  80-4.5 MCG/ACT inhaler Commonly known as: Symbicort    doxycycline  100 MG capsule Commonly known as: VIBRAMYCIN        TAKE these medications    acetaminophen  500 MG tablet Commonly known as: TYLENOL  Take 500 mg by mouth every 6 (six) hours as needed for headache.   albuterol  108 (90 Base) MCG/ACT inhaler Commonly known as: VENTOLIN  HFA Inhale 2 puffs into the lungs every 6 (six) hours as needed for wheezing or shortness of breath.   amoxicillin -clavulanate 875-125 MG tablet Commonly known as: AUGMENTIN  Take 1 tablet by mouth 2 (two) times daily for 14 days.   cloNIDine  HCl 0.1 MG Tb12 ER tablet Commonly known as: KAPVAY  Take 1 tablet (0.1 mg total) by mouth 2 (two) times daily.   Fe Fum-Vit C-Vit B12-FA Caps capsule Commonly known as: TRIGELS-F FORTE Take 1 capsule by mouth 2 (two) times daily.   fluticasone -salmeterol 100-50 MCG/ACT Aepb Commonly known as: ADVAIR Inhale 1 puff into the lungs 2 (two) times daily.   gabapentin  300 MG capsule Commonly known as: NEURONTIN  Take 1 capsule (300 mg total) by mouth 3 (three) times daily.   hydrOXYzine  25 MG tablet Commonly known as: ATARAX  Take 25 mg by mouth 3 (three) times daily as needed.   medroxyPROGESTERone  150 MG/ML injection Commonly  known as: DEPO-PROVERA  Inject 150 mg into the muscle every 3 (three) months.   ondansetron  8 MG disintegrating tablet Commonly known as: ZOFRAN -ODT Take 8 mg by mouth every 8 (eight) hours.   PARoxetine  20 MG tablet Commonly known as: PAXIL  Take 1 tablet (20 mg total) by mouth daily.   QUEtiapine  50 MG tablet Commonly known as: SEROQUEL  Take 1 tablet (50 mg total) by mouth 2 (two) times daily.   Suboxone  8-2 MG Film Generic drug: Buprenorphine  HCl-Naloxone  HCl Place under the tongue 2 (two) times  daily.               Discharge Care Instructions  (From admission, onward)           Start     Ordered   08/31/23 0000  Discharge wound care:       Comments: Please change your dressing twice daily specially of your left lower leg until you are drainage improves. You can change dressing once daily of your arms. Please clean your wound with saline, you can also use soap and water, dry it nicely and then use sterile gauze to wrap.   08/31/23 1049            Follow-up Information     Lane Shope, MD Follow up on 09/25/2023.   Specialty: General Surgery Contact information: [address] [place] [address] [phone]         Onita Devere POUR, MD. Schedule an appointment as soon as possible for a visit in 1 week(s).   Specialty: Family Medicine Contact information: [address] [place] [address] [phone]                Discharge Exam: Vicki Whitehead   08/27/23 1550  Weight: 83.9 kg   General.  Well-developed lady, in no acute distress. Pulmonary.  Lungs clear bilaterally, normal respiratory effort. CV.  Regular rate and rhythm, no JVD, rub or murmur. Abdomen.  Soft, nontender, nondistended, BS positive. CNS.  Alert and oriented .  No focal neurologic deficit. Extremities.  Left lower leg with bandage, bilateral upper extremities with bandages, no edema or erythema Psychiatry.  Judgment and insight appears normal.   Condition at discharge: stable  The results of significant diagnostics from this hospitalization (including imaging, microbiology, ancillary and laboratory) are listed below for reference.   Imaging Studies: DG Forearm Left Result Date: 08/27/2023 CLINICAL DATA:  Skin infection.  Evaluate for osteomyelitis. EXAM: LEFT HUMERUS - 2+ VIEW; LEFT FOREARM - 2 VIEW COMPARISON:  None Available. FINDINGS: No acute fracture or dislocation. The bones are well mineralized. No arthritic changes. No periosteal elevation  or bone erosion. Implantable contraceptive in the soft tissues of the arm. A 5 mm linear density in the soft tissues of the anterior elbow noted. There is ulceration of the skin of the forearm. Small pocket of soft tissue gas in the dorsum of the forearm may be within the skin. IMPRESSION: 1. No acute osseous abnormality. No radiographic evidence of osteomyelitis. 2. Soft tissue ulceration of the forearm. Electronically Signed   By: Vanetta Chou M.D.   On: 08/27/2023 18:40   DG Humerus Left Result Date: 08/27/2023 CLINICAL DATA:  Skin infection.  Evaluate for osteomyelitis. EXAM: LEFT HUMERUS - 2+ VIEW; LEFT FOREARM - 2 VIEW COMPARISON:  None Available. FINDINGS: No acute fracture or dislocation. The bones are well mineralized. No arthritic changes. No periosteal elevation or bone erosion. Implantable contraceptive in the soft tissues of the arm. A 5  mm linear density in the soft tissues of the anterior elbow noted. There is ulceration of the skin of the forearm. Small pocket of soft tissue gas in the dorsum of the forearm may be within the skin. IMPRESSION: 1. No acute osseous abnormality. No radiographic evidence of osteomyelitis. 2. Soft tissue ulceration of the forearm. Electronically Signed   By: Vanetta Chou M.D.   On: 08/27/2023 18:40   DG Humerus Right Result Date: 08/27/2023 CLINICAL DATA:  Soft tissue infection EXAM: RIGHT HUMERUS - 2+ VIEW COMPARISON:  None Available. FINDINGS: There is no evidence of fracture or other focal bone lesions. There is soft tissue swelling and air in the lateral mid arm soft tissues. No foreign body. IMPRESSION: Soft tissue swelling and air in the lateral mid arm soft tissues. No foreign body. Electronically Signed   By: Greig Pique M.D.   On: 08/27/2023 18:39   DG Tibia/Fibula Left Result Date: 08/27/2023 CLINICAL DATA:  Skin infection. EXAM: LEFT TIBIA AND FIBULA - 2 VIEW COMPARISON:  Radiograph dated 06/04/2023. FINDINGS: No acute fracture or dislocation.  The bones are well mineralized. No periosteal elevation or bone erosion. Chronic calcification adjacent to the medial malleolus. There is diffuse subcutaneous edema. There is ulceration of the skin of the anterior distal shin. No radiopaque foreign object or subcu gas. IMPRESSION: 1. No acute osseous abnormality. 2. Diffuse subcutaneous edema. 3. Ulceration of the skin of the anterior distal shin. Electronically Signed   By: Vanetta Chou M.D.   On: 08/27/2023 18:37    Microbiology: Results for orders placed or performed during the hospital encounter of 08/27/23  Culture, blood (Routine x 2)     Status: None (Preliminary result)   Collection Time: 08/27/23  5:15 PM   Specimen: BLOOD  Result Value Ref Range Status   Specimen Description BLOOD RIGHT ANTECUBITAL  Final   Special Requests   Final    BOTTLES DRAWN AEROBIC ONLY Blood Culture results may not be optimal due to an inadequate volume of blood received in culture bottles   Culture   Final    NO GROWTH 4 DAYS Performed at [facility], [address] [place], [place] [address]    Report Status PENDING  Incomplete  Culture, blood (Routine x 2)     Status: None (Preliminary result)   Collection Time: 08/27/23  5:25 PM   Specimen: BLOOD  Result Value Ref Range Status   Specimen Description BLOOD BLOOD RIGHT FOREARM  Final   Special Requests   Final    BOTTLES DRAWN AEROBIC ONLY Blood Culture adequate volume   Culture   Final    NO GROWTH 4 DAYS Performed at [facility], [address], [place], [place] [address]    Report Status PENDING  Incomplete  Aerobic Culture w Gram Stain (superficial specimen)     Status: None (Preliminary result)   Collection Time: 08/27/23  7:50 PM   Specimen: Wound  Result Value Ref Range Status   Specimen Description   Final    WOUND Performed at [facility], [address], [place], [place] [address]    Special Requests   Final    LL Performed at [facility], [address] [place], [place] [address]    Gram Stain NO WBC SEEN NO ORGANISMS SEEN   Final   Culture   Final    MODERATE STREPTOCOCCUS CONSTELLATUS SUSCEPTIBILITIES TO FOLLOW FEW HAEMOPHILUS PARAINFLUENZAE BETA LACTAMASE NEGATIVE Performed at [facility], [address] [place], [place] [address]    Report Status PENDING  Incomplete  Aerobic Culture w Gram Stain (superficial specimen)     Status: None (Preliminary result)   Collection Time: 08/27/23  7:50 PM   Specimen: Wound  Result Value Ref Range Status   Specimen Description WOUND  Final   Special Requests LA  Final   Gram Stain   Final    NO WBC SEEN RARE GRAM NEGATIVE RODS RARE GRAM POSITIVE COCCI IN PAIRS    Culture   Final    ABUNDANT STREPTOCOCCUS ANGINOSIS ABUNDANT STREPTOCOCCUS CONSTELLATUS SUSCEPTIBILITIES TO FOLLOW    Report Status PENDING  Incomplete   Organism ID, Bacteria STREPTOCOCCUS ANGINOSIS  Final      Susceptibility   Streptococcus anginosis - MIC*    PENICILLIN <=0.06 SENSITIVE Sensitive     CEFTRIAXONE  0.25 SENSITIVE Sensitive     ERYTHROMYCIN >=8 RESISTANT Resistant     LEVOFLOXACIN 0.5 SENSITIVE Sensitive     VANCOMYCIN  Value in next row Sensitive      1 SENSITIVEPerformed at [facility], [address], [place], [place] [address]    * ABUNDANT STREPTOCOCCUS ANGINOSIS  Aerobic Culture w Gram Stain (superficial specimen)     Status: None   Collection Time: 08/27/23  7:50 PM   Specimen: Wound  Result Value Ref Range Status   Specimen Description   Final    WOUND Performed at [facility], [address], [place], [place] [address]    Special Requests   Final    RL Performed at [facility], [address] [place], [place] [address]    Gram Stain NO WBC SEEN NO ORGANISMS SEEN   Final   Culture   Final    NO GROWTH 4 DAYS Performed at [facility], [address], [place], [place] [address]    Report Status 08/31/2023 FINAL   Final    Labs: CBC: Recent Labs  Lab 08/27/23 1722 08/28/23 0616  WBC 14.6* 7.1  NEUTROABS 11.3*  --   HGB 9.2* 8.8*  HCT 29.2* 27.3*  MCV 78.9* 79.6*  PLT 628* 609*   Basic Metabolic Panel: Recent Labs  Lab 08/27/23 1722 08/28/23 0616 08/30/23 0433  NA 133* 139  --   K 4.1 4.2  --   CL 97* 106  --   CO2 24 23  --   GLUCOSE 116* 98  --   BUN 8 7  --   CREATININE 1.08* 0.93 0.95  CALCIUM 9.0 8.2*  --    Liver Function Tests: Recent Labs  Lab 08/27/23 1722  AST 24  ALT 11  ALKPHOS 103  BILITOT 0.4  PROT 8.5*  ALBUMIN 2.9*   CBG: No results for input(s): GLUCAP in the last 168 hours.  Discharge time spent: greater than 30 minutes.  This record has been created using Conservation officer, historic buildings. Errors have been sought and corrected,but may not always be located. Such creation errors do not reflect on the standard of care.   Signed: Amaryllis Dare, MD [facility] 08/31/2023

## 2023-09-01 LAB — CULTURE, BLOOD (ROUTINE X 2)
Culture: NO GROWTH
Culture: NO GROWTH
Special Requests: ADEQUATE

## 2023-09-12 ENCOUNTER — Encounter: Payer: Self-pay | Admitting: Family Medicine

## 2023-09-12 ENCOUNTER — Ambulatory Visit: Admitting: Family Medicine

## 2023-09-12 VITALS — BP 117/73 | HR 79 | Temp 98.0°F | Resp 18 | Ht 71.0 in | Wt 185.0 lb

## 2023-09-12 DIAGNOSIS — E871 Hypo-osmolality and hyponatremia: Secondary | ICD-10-CM

## 2023-09-12 DIAGNOSIS — F1990 Other psychoactive substance use, unspecified, uncomplicated: Secondary | ICD-10-CM | POA: Diagnosis not present

## 2023-09-12 DIAGNOSIS — A419 Sepsis, unspecified organism: Secondary | ICD-10-CM | POA: Diagnosis not present

## 2023-09-12 NOTE — Assessment & Plan Note (Signed)
 Is on Suboxone  and is planning to switch to sublocade  in about three weeks.  Cannot get an appointment with psychiatry until she completes an intensive outpatient substance abuse course.  Have advised that I will fill out an FMLA so she does not jeopardize her job by going to treatment.

## 2023-09-12 NOTE — Progress Notes (Signed)
 Established Patient Office Visit  Subjective   Patient ID: Vicki Whitehead, female    DOB: 1985/09/02  Age: 38 y.o. MRN: [id number]  Chief Complaint  Patient presents with   Hospitalization Follow-up    HPI Discussed the use of AI scribe software for clinical note transcription with the patient, who gave verbal consent to proceed.  History of Present Illness   Vicki Whitehead is a 38 year old female who presents with a recent severe infection 2/2 IVDU requiring hospitalization and ongoing substance use treatment.  She experienced a severe sepsis from cellulitis, and multiple abscesses.  She was treated with intravenous antibiotics, including vancomycin  and Rocephin , for four days. She had several other places of cellulitis and abscess formation.  She is currently on oral Augmentin  with three to four days remaining in the course.  Her wound cultures grew strep anginosus, strep constellatus and Haemophilus parainfluenza, blood cultures were negative.    She has enrolled in a Suboxone  treatment program at [facility] in [place]. She is on Suboxone  and is scheduled to receive a Sublocade  injection in three weeks. Despite the availability of counseling, she has not engaged due to time constraints from work and family responsibilities. She has a history of multiple inpatient treatment programs and wants to quit drugs to improve her relationship with her daughter and family.  She is currently taking Seroquel , Paxil , clonidine , and hydroxyzine . She does not have a psychiatrist at present due to the requirement of completing an outpatient program before accessing psychiatric care.  She works as an Ship broker in catering, which she describes as stressful. She is responsible for caring for her mother and sister, who are in poor health, and managing household responsibilities, including caring for horses.         ROS    Objective:     BP 117/73 (BP Location: Right Arm,  Patient Position: Sitting, Cuff Size: Normal)   Pulse 79   Temp 98 F (36.7 C) (Oral)   Resp 18   Ht 5' 11 (1.803 m)   Wt 185 lb (83.9 kg)   SpO2 96%   BMI 25.80 kg/m    Physical Exam Vitals and nursing note reviewed.  Constitutional:      Appearance: Normal appearance.  HENT:     Head: Normocephalic and atraumatic.  Eyes:     Conjunctiva/sclera: Conjunctivae normal.  Cardiovascular:     Rate and Rhythm: Normal rate and regular rhythm.  Pulmonary:     Effort: Pulmonary effort is normal.     Breath sounds: Normal breath sounds.  Musculoskeletal:     Right lower leg: No edema.     Left lower leg: No edema.  Skin:    General: Skin is warm and dry.     Comments: Multiple areas of open wounds, one on left forearm and left arm, right arm, left leg has two.  No surrounding erythema, no warmth or tenderness.    Neurological:     Mental Status: She is alert and oriented to person, place, and time.  Psychiatric:        Mood and Affect: Mood normal.        Behavior: Behavior normal.        Thought Content: Thought content normal.        Judgment: Judgment normal.          No results found for any visits on 09/12/23.    The ASCVD Risk score (Arnett DK, et al., 2019) failed to  calculate for the following reasons:   The 2019 ASCVD risk score is only valid for ages 72 to 43   Risk score cannot be calculated because patient has a medical history suggesting prior/existing ASCVD    Assessment & Plan:  Hyponatremia -     CMP14+EGFR  Sepsis, due to unspecified organism, unspecified whether acute organ dysfunction present (HCC) -     CBC with Differential/Platelet  IVDU (intravenous drug user) Assessment & Plan: Is on Suboxone  and is planning to switch to sublocade  in about three weeks.  Cannot get an appointment with psychiatry until she completes an intensive outpatient substance abuse course.  Have advised that I will fill out an FMLA so she does not jeopardize her job by  going to treatment.        Return if symptoms worsen or fail to improve.    Johnda Billiot K Celeste Candelas, MD

## 2023-09-13 LAB — CBC WITH DIFFERENTIAL/PLATELET
Basophils Absolute: 0 x10E3/uL (ref 0.0–0.2)
Basos: 1 %
EOS (ABSOLUTE): 0.3 x10E3/uL (ref 0.0–0.4)
Eos: 6 %
Hematocrit: 33.7 % — ABNORMAL LOW (ref 34.0–46.6)
Hemoglobin: 10.5 g/dL — ABNORMAL LOW (ref 11.1–15.9)
Immature Grans (Abs): 0 x10E3/uL (ref 0.0–0.1)
Immature Granulocytes: 0 %
Lymphocytes Absolute: 2 x10E3/uL (ref 0.7–3.1)
Lymphs: 40 %
MCH: 24.9 pg — ABNORMAL LOW (ref 26.6–33.0)
MCHC: 31.2 g/dL — ABNORMAL LOW (ref 31.5–35.7)
MCV: 80 fL (ref 79–97)
Monocytes Absolute: 0.5 x10E3/uL (ref 0.1–0.9)
Monocytes: 11 %
Neutrophils Absolute: 2.1 x10E3/uL (ref 1.4–7.0)
Neutrophils: 42 %
Platelets: 371 x10E3/uL (ref 150–450)
RBC: 4.21 x10E6/uL (ref 3.77–5.28)
RDW: 17.8 % — ABNORMAL HIGH (ref 11.7–15.4)
WBC: 4.9 x10E3/uL (ref 3.4–10.8)

## 2023-09-13 LAB — CMP14+EGFR
ALT: 13 IU/L (ref 0–32)
AST: 26 IU/L (ref 0–40)
Albumin: 3.9 g/dL (ref 3.9–4.9)
Alkaline Phosphatase: 75 IU/L (ref 44–121)
BUN/Creatinine Ratio: 11 (ref 9–23)
BUN: 10 mg/dL (ref 6–20)
Bilirubin Total: 0.4 mg/dL (ref 0.0–1.2)
CO2: 22 mmol/L (ref 20–29)
Calcium: 9.7 mg/dL (ref 8.7–10.2)
Chloride: 102 mmol/L (ref 96–106)
Creatinine, Ser: 0.91 mg/dL (ref 0.57–1.00)
Globulin, Total: 3.4 g/dL (ref 1.5–4.5)
Glucose: 91 mg/dL (ref 70–99)
Potassium: 4.1 mmol/L (ref 3.5–5.2)
Sodium: 138 mmol/L (ref 134–144)
Total Protein: 7.3 g/dL (ref 6.0–8.5)
eGFR: 83 mL/min/1.73 (ref >59)

## 2023-09-16 ENCOUNTER — Ambulatory Visit: Payer: Self-pay | Admitting: Family Medicine

## 2023-09-26 ENCOUNTER — Other Ambulatory Visit: Payer: Self-pay | Admitting: Family Medicine

## 2023-09-26 DIAGNOSIS — F319 Bipolar disorder, unspecified: Secondary | ICD-10-CM

## 2023-10-24 ENCOUNTER — Other Ambulatory Visit: Payer: Self-pay | Admitting: Medical Genetics

## 2023-10-24 ENCOUNTER — Other Ambulatory Visit: Payer: Self-pay

## 2023-10-24 DIAGNOSIS — F319 Bipolar disorder, unspecified: Secondary | ICD-10-CM

## 2023-10-24 DIAGNOSIS — Z006 Encounter for examination for normal comparison and control in clinical research program: Secondary | ICD-10-CM

## 2023-10-28 ENCOUNTER — Other Ambulatory Visit: Payer: Self-pay | Admitting: Family Medicine

## 2023-10-28 DIAGNOSIS — F419 Anxiety disorder, unspecified: Secondary | ICD-10-CM

## 2023-11-23 ENCOUNTER — Other Ambulatory Visit: Payer: Self-pay | Admitting: Family Medicine

## 2023-11-23 DIAGNOSIS — G6289 Other specified polyneuropathies: Secondary | ICD-10-CM

## 2023-11-25 LAB — GENECONNECT MOLECULAR SCREEN: Genetic Analysis Overall Interpretation: NEGATIVE

## 2023-12-18 ENCOUNTER — Encounter: Payer: Self-pay | Admitting: Neurology

## 2023-12-18 ENCOUNTER — Ambulatory Visit: Admitting: Neurology

## 2023-12-18 VITALS — BP 137/88 | HR 70 | Ht 71.0 in | Wt 196.0 lb

## 2023-12-18 DIAGNOSIS — R569 Unspecified convulsions: Secondary | ICD-10-CM

## 2023-12-18 NOTE — Patient Instructions (Signed)
 Routine EEG, I will contact you to go over the result Continue your other medications Please call for updates Return sooner if worse
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
## 2023-12-30 ENCOUNTER — Ambulatory Visit: Admitting: *Deleted

## 2023-12-30 DIAGNOSIS — R569 Unspecified convulsions: Secondary | ICD-10-CM

## 2023-12-30 NOTE — Procedures (Signed)
" ° °  History:  38 year old woman with seizure like activity   EEG classification:  Awake and asleep  Duration: 29 minutes   Technical aspects: This EEG study was done with scalp electrodes positioned according to the 10-20 International system of electrode placement. Electrical activity was reviewed with band pass filter of 1-70Hz , sensitivity of 7 uV/mm, display speed of 43mm/sec with a 60Hz  notched filter applied as appropriate. EEG data were recorded continuously and digitally stored.   Description of the recording: The background rhythms of this recording consists of a fairly well modulated medium amplitude background activity of 10 Hz. As the record progresses, the patient initially is in the waking state, but appears to enter the early stage II sleep during the recording, with rudimentary sleep spindles and vertex sharp wave activity seen. During the wakeful state, photic stimulation was performed, and no abnormal responses were seen. Hyperventilation was also performed, no abnormal response seen. No epileptiform discharges seen during this recording. There was no focal slowing.   Abnormality: None   Impression: This is a normal awake and sleep EEG. No evidence of interictal epileptiform discharges. Normal EEGs, however, do not rule out epilepsy.    Pastor Falling, MD [facility]  "

## 2024-01-01 ENCOUNTER — Ambulatory Visit: Payer: Self-pay | Admitting: Neurology

## 2024-02-04 ENCOUNTER — Encounter: Payer: Self-pay | Admitting: Family Medicine

## 2024-02-04 ENCOUNTER — Ambulatory Visit: Admitting: Family Medicine

## 2024-02-04 VITALS — BP 121/72 | HR 79 | Temp 98.0°F | Resp 16 | Ht 71.0 in | Wt 202.8 lb

## 2024-02-04 DIAGNOSIS — Z3201 Encounter for pregnancy test, result positive: Secondary | ICD-10-CM

## 2024-02-04 DIAGNOSIS — F319 Bipolar disorder, unspecified: Secondary | ICD-10-CM

## 2024-02-04 DIAGNOSIS — R300 Dysuria: Secondary | ICD-10-CM | POA: Diagnosis not present

## 2024-02-04 DIAGNOSIS — Z3490 Encounter for supervision of normal pregnancy, unspecified, unspecified trimester: Secondary | ICD-10-CM | POA: Insufficient documentation

## 2024-02-04 DIAGNOSIS — Z3A01 Less than 8 weeks gestation of pregnancy: Secondary | ICD-10-CM

## 2024-02-04 LAB — POCT URINALYSIS DIP (CLINITEK)
Bilirubin, UA: NEGATIVE
Blood, UA: NEGATIVE
Glucose, UA: NEGATIVE mg/dL
Ketones, POC UA: NEGATIVE mg/dL
Nitrite, UA: POSITIVE — AB
POC PROTEIN,UA: NEGATIVE
Spec Grav, UA: 1.025
Urobilinogen, UA: 0.2 U/dL
pH, UA: 5.5

## 2024-02-04 LAB — POCT URINE PREGNANCY: Preg Test, Ur: POSITIVE — AB

## 2024-02-04 NOTE — Assessment & Plan Note (Addendum)
 She reports she is the most stable she has ever been on current regimen of Clonidine  0.1 mg ER twice daily, Paxil  20 mg daily, Seroquel  50 mg twice daily and gabapentin  300 nightly.  Hesitant to change her medication because she is stable right now. Hydroxyzine  25 mg 3 times daily as needed.

## 2024-02-04 NOTE — Assessment & Plan Note (Signed)
 Having some frequency and mild dysuria.  No suprapubic pain or back pain.  No fever.  No empiric antibiotics that she is pregnant, we will wait for culture return.

## 2024-02-04 NOTE — Progress Notes (Signed)
 "  Established Patient Office Visit  Subjective   Patient ID: Vicki Whitehead, female    DOB: December 15, 1985  Age: 39 y.o. MRN: [id number]  Chief Complaint  Patient presents with   Pregnancy    Pregnancy. Pt had LMP on 12/17/2023. Had positive pregnancy test on 01/30/2024. Needs OBGYN. Nauseous and has to urinate a lot.    HPI Delightful 39 year old here with a positive pregnancy test.  She reports she is having to pee frequently but she denies dysuria and back pain.  She has had 1 previous pregnancy that required bedrest after the fourth month for preeclampsia and then an emergency C-section 6 weeks early for preeclampsia.  She did not have blood pressure problems now. She is having some nausea and she feels really tired she does have a prescription for ondansetron . First day of her last menstrual cycle was December 10.  Her periods are monthly and regular. She is currently taking Seroquel , Paxil , clonidine , hydroxyzine  and Sublocade  injections every 3 weeks.  She goes to the [facility] in [place] for her Sublocade .  She has not found a psychiatrist so I am writing her other medications.  She reports that she feels the most stable she has ever been.  She still complains of issues with ADHD but her bipolar and borderline personality problems are well-controlled right now.    Objective:     BP 121/72   Pulse 79   Temp 98 F (36.7 C) (Oral)   Resp 16   Ht 5' 11 (1.803 m)   Wt 202 lb 12.8 oz (92 kg)   LMP 12/17/2023 (Exact Date)   SpO2 98%   BMI 28.28 kg/m    Physical Exam Vitals and nursing note reviewed.  Constitutional:      Appearance: Normal appearance.  HENT:     Head: Normocephalic and atraumatic.  Eyes:     Conjunctiva/sclera: Conjunctivae normal.  Cardiovascular:     Rate and Rhythm: Normal rate and regular rhythm.  Pulmonary:     Effort: Pulmonary effort is normal.     Breath sounds: Normal breath sounds.  Musculoskeletal:     Right lower leg: No  edema.     Left lower leg: No edema.  Skin:    General: Skin is warm and dry.  Neurological:     Mental Status: She is alert and oriented to person, place, and time.  Psychiatric:        Mood and Affect: Mood normal.        Behavior: Behavior normal.        Thought Content: Thought content normal.        Judgment: Judgment normal.          Results for orders placed or performed in visit on 02/04/24  POCT urine pregnancy  Result Value Ref Range   Preg Test, Ur Positive (A) Negative  POCT URINALYSIS DIP (CLINITEK)  Result Value Ref Range   Color, UA yellow yellow   Clarity, UA clear clear   Glucose, UA negative negative mg/dL   Bilirubin, UA negative negative   Ketones, POC UA negative negative mg/dL   Spec Grav, UA 8.974 8.989 - 1.025   Blood, UA negative negative   pH, UA 5.5 5.0 - 8.0   POC PROTEIN,UA negative negative, trace   Urobilinogen, UA 0.2 0.2 or 1.0 E.U./dL   Nitrite, UA Positive (A) Negative   Leukocytes, UA Small (1+) (A) Negative      The ASCVD Risk score (Arnett DK,  et al., 2019) failed to calculate for the following reasons:   The 2019 ASCVD risk score is only valid for ages 11 to 39   Risk score cannot be calculated because patient has a medical history suggesting prior/existing ASCVD   * - Cholesterol units were assumed    Assessment & Plan:  Pregnancy test positive -     POCT urine pregnancy  Dysuria Assessment & Plan: Having some frequency and mild dysuria.  No suprapubic pain or back pain.  No fever.  No empiric antibiotics that she is pregnant, we will wait for culture return.  Orders: -     POCT URINALYSIS DIP (CLINITEK) -     Urine Culture  Bipolar 1 disorder (HCC) Assessment & Plan: She reports she is the most stable she has ever been on current regimen of Clonidine  0.1 mg ER twice daily, Paxil  20 mg daily, Seroquel  50 mg twice daily and gabapentin  300 nightly Hydroxyzine  25 mg 3 times daily as needed.      Less than [redacted] weeks  gestation of pregnancy Assessment & Plan: Reports the first day of her last period was December 10.  Her menstrual cycles are monthly and on schedule.  Refer to OB/GYN. Ask her to pick up prenatal vitamins with iron today and start taking 1 a day with food.      Return if symptoms worsen or fail to improve.    Toben Acuna K Shail Urbas, MD "

## 2024-02-04 NOTE — Assessment & Plan Note (Addendum)
 Reports the first day of her last period was December 10.  Her menstrual cycles are monthly and on schedule.  Refer to OB/GYN. Ask her to pick up prenatal vitamins with iron today and start taking 1 a day with food.

## 2024-02-11 ENCOUNTER — Encounter: Payer: Self-pay | Admitting: Family Medicine

## 2024-02-11 LAB — URINE CULTURE

## 2024-02-12 ENCOUNTER — Ambulatory Visit: Payer: Self-pay | Admitting: Family Medicine

## 2024-02-13 ENCOUNTER — Emergency Department
Admission: EM | Admit: 2024-02-13 | Discharge: 2024-02-13 | Disposition: A | Discharge status: Home / Self Care | Source: Home / Self Care | Attending: Emergency Medicine | Admitting: Emergency Medicine

## 2024-02-13 ENCOUNTER — Other Ambulatory Visit: Payer: Self-pay

## 2024-02-13 DIAGNOSIS — N390 Urinary tract infection, site not specified: Secondary | ICD-10-CM

## 2024-02-13 LAB — URINALYSIS, ROUTINE W REFLEX MICROSCOPIC
Bilirubin Urine: NEGATIVE
Glucose, UA: NEGATIVE mg/dL
Hgb urine dipstick: NEGATIVE
Ketones, ur: NEGATIVE mg/dL
Nitrite: NEGATIVE
Protein, ur: NEGATIVE mg/dL
Specific Gravity, Urine: 1.012 (ref 1.005–1.030)
pH: 7 (ref 5.0–8.0)

## 2024-02-13 MED ORDER — NITROFURANTOIN MONOHYD MACRO 100 MG PO CAPS
100.0000 mg | ORAL_CAPSULE | Freq: Once | ORAL | Status: AC
  Administered 2024-02-13: 100 mg via ORAL
  Filled 2024-02-13: qty 1

## 2024-02-13 MED ORDER — NITROFURANTOIN MONOHYD MACRO 100 MG PO CAPS: 100.0000 mg | ORAL_CAPSULE | Freq: Two times a day (BID) | ORAL | 0 refills | Status: AC

## 2024-02-13 NOTE — Discharge Instructions (Signed)
 Please take antibiotic as prescribed twice daily for neck 7 days.  Please call the number provided for OB/GYN to arrange a follow-up appointment.  Return to the emergency department for any symptom concerning to yourself.

## 2024-02-13 NOTE — ED Triage Notes (Signed)
 Pt presents to ED via POV due to a diagnosed Ecoli in urine last Thursday. Pt is [redacted] weeks pregnant. Pt endorses foul smelling urine and urgency. Pt  endorses nausea and vomiting. Pt states that her infection is antibiotic resistant. Endorses lower abdominal pressure and mild cramping.

## 2024-02-13 NOTE — Telephone Encounter (Signed)
Attempted to contact. LVM 

## 2024-02-13 NOTE — ED Provider Notes (Signed)
" ° °  [facility] Provider Note    Event Date/Time   First MD Initiated Contact with Patient 02/13/24 1530     (approximate)  History   Chief Complaint: Cystitis  HPI  Liliana Dang is a 39 y.o. female with a past medical history of anemia, COPD, approximately [redacted] weeks pregnant who presents to the emergency department for urinary tract infection.  According to the patient she had a urine confirmatory test at her doctor 1 week ago.  They called her and told her that it was E. coli positive and resistant to most antibiotics and told her to come to the emergency department for an evaluation.  Physical Exam   Triage Vital Signs: ED Triage Vitals  Encounter Vitals Group     BP 02/13/24 1342 135/78     Girls Systolic BP Percentile --      Girls Diastolic BP Percentile --      Boys Systolic BP Percentile --      Boys Diastolic BP Percentile --      Pulse Rate 02/13/24 1342 74     Resp 02/13/24 1342 18     Temp 02/13/24 1342 98.3 F (36.8 C)     Temp Source 02/13/24 1342 Oral     SpO2 02/13/24 1342 100 %     Weight 02/13/24 1343 202 lb (91.6 kg)     Height 02/13/24 1343 5' 11 (1.803 m)     Head Circumference --      Peak Flow --      Pain Score 02/13/24 1343 4     Pain Loc --      Pain Education --      Exclude from Growth Chart --     Most recent vital signs: Vitals:   02/13/24 1342  BP: 135/78  Pulse: 74  Resp: 18  Temp: 98.3 F (36.8 C)  SpO2: 100%    General: Awake, no distress.  CV:  Good peripheral perfusion.  Regular rate and rhythm  Resp:  Normal effort.  Equal breath sounds bilaterally.  Abd:  No distention.  Soft, nontender.   ED Results / Procedures / Treatments   MEDICATIONS ORDERED IN ED: Medications  nitrofurantoin  (macrocrystal-monohydrate) (MACROBID ) capsule 100 mg (has no administration in time range)     IMPRESSION / MDM / ASSESSMENT AND PLAN / ED COURSE  I reviewed the triage vital signs and the nursing  notes.  Patient's presentation is most consistent with acute illness / injury with system symptoms.  Patient presents emergency department for urinary tract infection.  I was able to review the patient's urine culture it was E. coli greater than 100,000 colonies.  Patient's sensitivity is resistant to multiple antibiotics but is sensitive to nitrofurantoin .  We will place patient on Macrobid  twice daily for 7 days.  We also give the patient OB follow-up information.  Patient agreeable to plan of care  FINAL CLINICAL IMPRESSION(S) / ED DIAGNOSES   Urinary tract infection  Note:  This document was prepared using Dragon voice recognition software and may include unintentional dictation errors.   Dorothyann Drivers, MD 02/13/24 1545  "

## 2024-03-09 ENCOUNTER — Ambulatory Visit: Payer: Self-pay | Admitting: Psychiatry
